# Patient Record
Sex: Male | Born: 1955 | Race: White | Hispanic: No | Marital: Married | Smoking: Former smoker
Health system: Southern US, Community
[De-identification: ages and names within clinical notes are randomized; demographics above are authoritative.]

## PROBLEM LIST (undated history)

## (undated) DIAGNOSIS — F32A Depression, unspecified: Secondary | ICD-10-CM

## (undated) DIAGNOSIS — E785 Hyperlipidemia, unspecified: Secondary | ICD-10-CM

## (undated) DIAGNOSIS — R413 Other amnesia: Secondary | ICD-10-CM

## (undated) DIAGNOSIS — F329 Major depressive disorder, single episode, unspecified: Secondary | ICD-10-CM

## (undated) DIAGNOSIS — K219 Gastro-esophageal reflux disease without esophagitis: Secondary | ICD-10-CM

## (undated) DIAGNOSIS — F419 Anxiety disorder, unspecified: Secondary | ICD-10-CM

## (undated) HISTORY — PX: TONSILLECTOMY: SUR1361

## (undated) HISTORY — DX: Depression, unspecified: F32.A

## (undated) HISTORY — DX: Hyperlipidemia, unspecified: E78.5

## (undated) HISTORY — PX: FRACTURE SURGERY: SHX138

## (undated) HISTORY — DX: Anxiety disorder, unspecified: F41.9

## (undated) HISTORY — DX: Other amnesia: R41.3

## (undated) HISTORY — PX: ROTATOR CUFF REPAIR: SHX139

## (undated) HISTORY — PX: COLONOSCOPY: SHX174

## (undated) HISTORY — DX: Major depressive disorder, single episode, unspecified: F32.9

## (undated) HISTORY — PX: INGUINAL HERNIA REPAIR: SHX194

---

## 2001-02-02 ENCOUNTER — Encounter (INDEPENDENT_AMBULATORY_CARE_PROVIDER_SITE_OTHER): Payer: Self-pay | Admitting: Specialist

## 2001-02-02 ENCOUNTER — Ambulatory Visit (HOSPITAL_COMMUNITY): Admission: RE | Admit: 2001-02-02 | Discharge: 2001-02-02 | Payer: Self-pay | Admitting: Surgery

## 2001-12-30 ENCOUNTER — Encounter: Payer: Self-pay | Admitting: Surgery

## 2001-12-30 ENCOUNTER — Encounter: Admission: RE | Admit: 2001-12-30 | Discharge: 2001-12-30 | Payer: Self-pay | Admitting: Surgery

## 2004-12-31 ENCOUNTER — Ambulatory Visit: Payer: Self-pay | Admitting: Internal Medicine

## 2005-08-06 ENCOUNTER — Ambulatory Visit: Payer: Self-pay | Admitting: Internal Medicine

## 2005-09-16 ENCOUNTER — Ambulatory Visit: Payer: Self-pay | Admitting: Internal Medicine

## 2005-12-10 ENCOUNTER — Ambulatory Visit: Payer: Self-pay | Admitting: Internal Medicine

## 2006-05-26 ENCOUNTER — Ambulatory Visit: Payer: Self-pay | Admitting: Internal Medicine

## 2006-11-05 ENCOUNTER — Ambulatory Visit: Payer: Self-pay | Admitting: Internal Medicine

## 2006-11-25 ENCOUNTER — Ambulatory Visit: Payer: Self-pay | Admitting: Internal Medicine

## 2006-11-25 LAB — CONVERTED CEMR LAB
ALT: 18 units/L (ref 0–40)
AST: 22 units/L (ref 0–37)
Albumin: 4.1 g/dL (ref 3.5–5.2)
Alkaline Phosphatase: 52 units/L (ref 39–117)
BUN: 20 mg/dL (ref 6–23)
Basophils Absolute: 0 10*3/uL (ref 0.0–0.1)
Basophils Relative: 0.3 % (ref 0.0–1.0)
Bilirubin, Direct: 0.1 mg/dL (ref 0.0–0.3)
CO2: 31 meq/L (ref 19–32)
Calcium: 9.6 mg/dL (ref 8.4–10.5)
Chloride: 106 meq/L (ref 96–112)
Cholesterol: 198 mg/dL (ref 0–200)
Creatinine, Ser: 0.8 mg/dL (ref 0.4–1.5)
Eosinophils Absolute: 0.1 10*3/uL (ref 0.0–0.6)
Eosinophils Relative: 1.8 % (ref 0.0–5.0)
GFR calc Af Amer: 131 mL/min
GFR calc non Af Amer: 108 mL/min
Glucose, Bld: 86 mg/dL (ref 70–99)
HCT: 41.2 % (ref 39.0–52.0)
HDL: 41.6 mg/dL (ref >39.0)
Hemoglobin: 14.8 g/dL (ref 13.0–17.0)
LDL Cholesterol: 136 mg/dL — ABNORMAL HIGH (ref 0–99)
Lymphocytes Relative: 30.6 % (ref 12.0–46.0)
MCHC: 35.9 g/dL (ref 30.0–36.0)
MCV: 88.1 fL (ref 78.0–100.0)
Monocytes Absolute: 0.5 10*3/uL (ref 0.2–0.7)
Monocytes Relative: 8.4 % (ref 3.0–11.0)
Neutro Abs: 3.6 10*3/uL (ref 1.4–7.7)
Neutrophils Relative %: 58.9 % (ref 43.0–77.0)
PSA: 1.01 ng/mL (ref 0.10–4.00)
Platelets: 270 10*3/uL (ref 150–400)
Potassium: 4.2 meq/L (ref 3.5–5.1)
RBC: 4.67 M/uL (ref 4.22–5.81)
RDW: 11.8 % (ref 11.5–14.6)
Sodium: 143 meq/L (ref 135–145)
TSH: 1.83 microintl units/mL (ref 0.35–5.50)
Total Bilirubin: 1.2 mg/dL (ref 0.3–1.2)
Total CHOL/HDL Ratio: 4.8
Total Protein: 6.9 g/dL (ref 6.0–8.3)
Triglycerides: 101 mg/dL (ref 0–149)
VLDL: 20 mg/dL (ref 0–40)
WBC: 6.1 10*3/uL (ref 4.5–10.5)

## 2006-12-01 ENCOUNTER — Ambulatory Visit: Payer: Self-pay | Admitting: Internal Medicine

## 2006-12-07 ENCOUNTER — Ambulatory Visit (HOSPITAL_COMMUNITY): Admission: RE | Admit: 2006-12-07 | Discharge: 2006-12-07 | Payer: Self-pay | Admitting: Orthopaedic Surgery

## 2006-12-29 ENCOUNTER — Ambulatory Visit: Payer: Self-pay | Admitting: Gastroenterology

## 2007-01-27 ENCOUNTER — Ambulatory Visit: Payer: Self-pay | Admitting: Gastroenterology

## 2007-02-13 ENCOUNTER — Encounter: Payer: Self-pay | Admitting: Internal Medicine

## 2007-02-13 ENCOUNTER — Telehealth: Payer: Self-pay | Admitting: Internal Medicine

## 2007-02-13 DIAGNOSIS — F329 Major depressive disorder, single episode, unspecified: Secondary | ICD-10-CM | POA: Insufficient documentation

## 2007-02-13 DIAGNOSIS — E785 Hyperlipidemia, unspecified: Secondary | ICD-10-CM | POA: Insufficient documentation

## 2007-02-13 DIAGNOSIS — F411 Generalized anxiety disorder: Secondary | ICD-10-CM | POA: Insufficient documentation

## 2010-03-07 ENCOUNTER — Telehealth: Payer: Self-pay | Admitting: Family Medicine

## 2010-03-07 ENCOUNTER — Ambulatory Visit: Payer: Self-pay | Admitting: Family Medicine

## 2010-03-14 ENCOUNTER — Telehealth: Payer: Self-pay | Admitting: Family Medicine

## 2010-03-20 ENCOUNTER — Telehealth: Payer: Self-pay | Admitting: Internal Medicine

## 2010-03-29 ENCOUNTER — Ambulatory Visit: Payer: Self-pay | Admitting: Internal Medicine

## 2010-03-29 ENCOUNTER — Telehealth: Payer: Self-pay | Admitting: Internal Medicine

## 2010-03-29 DIAGNOSIS — G4700 Insomnia, unspecified: Secondary | ICD-10-CM | POA: Insufficient documentation

## 2010-04-26 ENCOUNTER — Ambulatory Visit: Payer: Self-pay | Admitting: Internal Medicine

## 2010-04-30 ENCOUNTER — Ambulatory Visit: Payer: Self-pay | Admitting: Licensed Clinical Social Worker

## 2010-05-02 ENCOUNTER — Encounter: Payer: Self-pay | Admitting: Internal Medicine

## 2010-05-15 ENCOUNTER — Ambulatory Visit: Payer: Self-pay | Admitting: Licensed Clinical Social Worker

## 2010-05-16 ENCOUNTER — Ambulatory Visit: Payer: Self-pay | Admitting: Internal Medicine

## 2010-05-22 ENCOUNTER — Ambulatory Visit: Payer: Self-pay | Admitting: Licensed Clinical Social Worker

## 2010-05-29 ENCOUNTER — Ambulatory Visit: Payer: Self-pay | Admitting: Licensed Clinical Social Worker

## 2010-06-07 ENCOUNTER — Ambulatory Visit: Payer: Self-pay | Admitting: Licensed Clinical Social Worker

## 2010-06-19 ENCOUNTER — Ambulatory Visit: Payer: Self-pay | Admitting: Licensed Clinical Social Worker

## 2010-06-28 ENCOUNTER — Ambulatory Visit: Payer: Self-pay | Admitting: Licensed Clinical Social Worker

## 2010-07-05 ENCOUNTER — Ambulatory Visit: Payer: Self-pay | Admitting: Licensed Clinical Social Worker

## 2010-07-12 ENCOUNTER — Ambulatory Visit: Payer: Self-pay | Admitting: Licensed Clinical Social Worker

## 2010-07-18 ENCOUNTER — Ambulatory Visit: Payer: Self-pay | Admitting: Internal Medicine

## 2010-08-21 NOTE — Assessment & Plan Note (Signed)
Summary: anxiety/dm   Vital Signs:  Patient profile:   55 year old male Weight:      173 pounds Temp:     98.4 degrees F oral BP sitting:   122 / 80  (left arm) Cuff size:   regular  Vitals Entered By: Kathrynn Speed CMA (March 07, 2010 2:13 PM) CC: anxiety, 2 weeks, restness, trouble sleeping,maybe depression has stressful job, having trouble concentrating,having trouble getting things done, wife says he ask same question twice alot, src   History of Present Illness: Patient seen with daily symptoms of increased anxiety worsening over the past few weeks. Similar symptoms in past. Frequently feels jittery and uptight. 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 Denies depressive symptoms. Previously prescribed paroxetine but he did not take regularly. No history of panic disorder.  no history of alcohol binge use or any illicit drug use.  Anxiety symptoms are starting to impair his life.    Current Medications (verified): 1)  None  Allergies (verified): 1)  ! Sulf-10  Past History:  Past Medical History: Last updated: 02/13/2007 Anxiety Depression Hyperlipidemia  Review of Systems  The patient denies anorexia, fever, weight loss, chest pain, headaches, abdominal pain, muscle weakness, and depression.    Physical Exam  General:  Well-developed,well-nourished,in no acute distress; alert,appropriate and cooperative throughout examination Neck:  No deformities, masses, or tenderness noted. Lungs:  Normal respiratory effort, chest expands symmetrically. Lungs are clear to auscultation, no crackles or wheezes. Heart:  Normal rate and regular rhythm. S1 and S2 normal without gallop, murmur, click, rub or other extra sounds. Neurologic:  alert & oriented X3 and cranial nerves II-XII intact.   Psych:  normally interactive, good eye contact, not depressed appearing, and moderately anxious.     Impression & Recommendations:  Problem # 1:   ANXIETY (ICD-300.00) Assessment Deteriorated start Sertraline and follow up with Dr Kirtland Bouchard in 2-3 weeks. The following medications were removed from the medication list:    Paroxetine Hcl 20 Mg Tabs (Paroxetine hcl) .Marland Kitchen... 1 once daily    Lorazepam 0.5 Mg Tabs (Lorazepam) .Marland Kitchen... 1 two times a day as needed His updated medication list for this problem includes:    Sertraline Hcl 50 Mg Tabs (Sertraline hcl) ..... One by mouth once daily  Complete Medication List: 1)  Sertraline Hcl 50 Mg Tabs (Sertraline hcl) .... One by mouth once daily  Patient Instructions: 1)  Schedule follow up with Dr Kirtland Bouchard in 2-3 weeks. Prescriptions: SERTRALINE HCL 50 MG TABS (SERTRALINE HCL) one by mouth once daily  #30 x 3   Entered and Authorized by:   Evelena Peat MD   Signed by:   Evelena Peat MD on 03/07/2010   Method used:   [facility] to        [facility]  [address] [phone][facility] (retail[facility]       [address]       [place], [place]  [address]       Ph: [phone] or [phone]       Fax: [phone]   RxID:   [id number]

## 2010-08-21 NOTE — Progress Notes (Signed)
  Phone Note Call from Patient   Caller: Patient Call For: Gordy Savers  MD Summary of Call: Pt is asking for RX for sleep.......Marland Kitchenhe has appt with Dr Kirtland Bouchard 04/02/2010.   Initial call taken by: Lynann Beaver CMA,  March 20, 2010 12:49 PM  Follow-up for Phone Call        generic Ambien 10 mg, number 30 Follow-up by: Gordy Savers  MD,  March 20, 2010 1:20 PM    New/Updated Medications: AMBIEN 10 MG TABS (ZOLPIDEM TARTRATE) one by mouth q hs as needed insomnia Prescriptions: AMBIEN 10 MG TABS (ZOLPIDEM TARTRATE) one by mouth q hs as needed insomnia  #30 x 0   Entered by:   Lynann Beaver CMA   Authorized by:   Gordy Savers  MD   Signed by:   Lynann Beaver CMA on 03/20/2010   Method used:   Telephoned to ...       [facility]  [address] [phone]* (retail[facility]       [address]       [place], [place]  [address]       Ph: [phone] or [phone]       Fax: [phone]   RxID:   [id number]  LM on pt' s personal voice mail per pt request.

## 2010-08-21 NOTE — Medication Information (Signed)
Summary: Ambien Approved  Ambien Approved   Imported By: Maryln Gottron 05/03/2010 13:55:08  _____________________________________________________________________  External Attachment:    Type:   Image     Comment:   External Document

## 2010-08-21 NOTE — Assessment & Plan Note (Signed)
Summary: 2 month fup//ccm   Vital Signs:  Patient profile:   55 year old male Height:      70 inches Weight:      170 pounds BMI:     24.48 Temp:     98.8 degrees F oral Pulse rate:   72 / minute Resp:     14 per minute BP sitting:   120 / 70  (left arm)  Vitals Entered By: Willy Eddy, LPN (May 16, 2010 10:39 AM) CC: wants to increase sertraline Is Patient Diabetic? No   CC:  wants to increase sertraline.  History of Present Illness: 55 year old patient who is seen today for follow-up of his anxiety disorder and insomnia.  He feels that he is improved, but still having some sleep issues.  He alternates over-the-counter melatonin, with prescription Ambien.  He feels that he has done well, but feels she might do better with A. upward dose titration.  Anxiety is his chief complaint he denies any real depression.  Preventive Screening-Counseling & Management  Alcohol-Tobacco     Smoking Status: quit  Current Problems (verified): 1)  Insomnia  (ICD-780.52) 2)  Hyperlipidemia  (ICD-272.4) 3)  Depression  (ICD-311) 4)  Anxiety  (ICD-300.00)  Current Medications (verified): 1)  Ambien 10 Mg Tabs (Zolpidem Tartrate) .... One By Mouth Q Hs As Needed Insomnia 2)  Sertraline Hcl 100 Mg Tabs (Sertraline Hcl) .... One Daily 3)  Lorazepam 0.5 Mg Tabs (Lorazepam) .... One Twice Daily As Needed For Anxiety  Allergies (verified): 1)  ! Sulf-10  Past History:  Past Medical History: Reviewed history from 02/13/2007 and no changes required. Anxiety Depression Hyperlipidemia  Physical Exam  General:  Well-developed,well-nourished,in no acute distress; alert,appropriate and cooperative throughout examination Psych:  Cognition and judgment appear intact. Alert and cooperative with normal attention span and concentration. No apparent delusions, illusions, hallucinations   Impression & Recommendations:  Problem # 1:  INSOMNIA (ICD-780.52)  His updated medication list for  this problem includes:    Ambien 10 Mg Tabs (Zolpidem tartrate) ..... One by mouth q hs as needed insomnia  His updated medication list for this problem includes:    Ambien 10 Mg Tabs (Zolpidem tartrate) ..... One by mouth q hs as needed insomnia  Problem # 2:  ANXIETY (ICD-300.00)  His updated medication list for this problem includes:    Sertraline Hcl 100 Mg Tabs (Sertraline hcl) ..... One and one half tablets  daily    Lorazepam 0.5 Mg Tabs (Lorazepam) ..... One twice daily as needed for anxiety  His updated medication list for this problem includes:    Sertraline Hcl 100 Mg Tabs (Sertraline hcl) ..... One and one half tablets  daily    Lorazepam 0.5 Mg Tabs (Lorazepam) ..... One twice daily as needed for anxiety  Complete Medication List: 1)  Ambien 10 Mg Tabs (Zolpidem tartrate) .... One by mouth q hs as needed insomnia 2)  Sertraline Hcl 100 Mg Tabs (Sertraline hcl) .... One and one half tablets  daily 3)  Lorazepam 0.5 Mg Tabs (Lorazepam) .... One twice daily as needed for anxiety  Patient Instructions: 1)  Please schedule a follow-up appointment in 2 months. 2)  It is important that you exercise regularly at least 20 minutes 5 times a week. If you develop chest pain, have severe difficulty breathing, or feel very tired , stop exercising immediately and seek medical attention. Prescriptions: SERTRALINE HCL 100 MG TABS (SERTRALINE HCL) one and one half tablets  daily  #  135 x 3   Entered and Authorized by:   Gordy Savers  MD   Signed by:   Gordy Savers  MD on 05/16/2010   Method used:   Print then Give to Patient   RxID:   [id number] LORAZEPAM 0.5 MG TABS (LORAZEPAM) one twice daily as needed for anxiety  #60 x 3   Entered and Authorized by:   Gordy Savers  MD   Signed by:   Gordy Savers  MD on 05/16/2010   Method used:   Print then Give to Patient   RxID:   [id number] AMBIEN 10 MG TABS (ZOLPIDEM TARTRATE) one by mouth q hs as needed  insomnia  #30 x 2   Entered and Authorized by:   Gordy Savers  MD   Signed by:   Gordy Savers  MD on 05/16/2010   Method used:   Print then Give to Patient   RxID:   [id number]    Orders Added: 1)  Est. Patient Level III [74[address]]

## 2010-08-21 NOTE — Progress Notes (Signed)
Summary: zolpidem question  Phone Note Call from Patient   Caller: Patient Call For: Gordy Savers  MD Reason for Call: Talk to Doctor Summary of Call: wanted to know if it would be ok to take zolpidem every night. Please advise Initial call taken by: Duard Brady LPN,  March 29, 2010 4:44 PM  Follow-up for Phone Call        only as needed- attempt to avoid daily use Follow-up by: Gordy Savers  MD,  March 29, 2010 5:10 PM  Additional Follow-up for Phone Call Additional follow up Details #1::        attempt to call - ans mach - LMTCB if questions - try not to use nightly- only as needed.  Additional Follow-up by: Duard Brady LPN,  March 29, 2010 5:21 PM

## 2010-08-21 NOTE — Progress Notes (Signed)
Summary: increase meds?  Phone Note Call from Patient   Caller: Patient Call For: Gordy Savers  MD Summary of Call: Pt is taking Sertraline, and is asking if he can increase it.  Still complaining of nervousness, and agitated.  Not sleeping. Becomes confused at times........moods are up and down.  Pt is also complaining  of anxiety. [phone] Initial call taken by: Lynann Beaver CMA,  March 14, 2010 11:37 AM  Follow-up for Phone Call        Too soon to titrate.  Just started last week.  We need to make sure he has appt with Dr Kirtland Bouchard by next week at latest.  If he is truly confused should see him sooner. Follow-up by: Evelena Peat MD,  March 14, 2010 1:06 PM  Additional Follow-up for Phone Call Additional follow up Details #1::        Phone Call Completed Additional Follow-up by: Rudy Jew, RN,  March 14, 2010 1:16 PM

## 2010-08-21 NOTE — Assessment & Plan Note (Signed)
Summary: 2 WEEK F/U // RS   Vital Signs:  Patient profile:   55 year old male Height:      70 inches Weight:      169 pounds BMI:     24.34 Temp:     97.3 degrees F oral BP sitting:   108 / 70  (right arm) Cuff size:   regular  Vitals Entered By: Duard Brady LPN (April 26, 2010 12:52 PM) CC: 2 wk rov- doing better? still with anxiety? depression? Is Patient Diabetic? No   CC:  2 wk rov- doing better? still with anxiety? depression?.  History of Present Illness: 55 year old patient who is seen today for follow-up of his anxiety, depression, and insomnia.  His been on medications for about 6 weeks and has enjoyed some modest improvement.  Still having some anxiety and insomnia issues, but seems to have very little, if any, depression.  He has added  melatonin to his regimen.  Allergies: 1)  ! Sulf-10  Past History:  Past Medical History: Reviewed history from 02/13/2007 and no changes required. Anxiety Depression Hyperlipidemia  Review of Systems       The patient complains of depression.  The patient denies anorexia, fever, weight loss, weight gain, vision loss, decreased hearing, hoarseness, chest pain, syncope, dyspnea on exertion, peripheral edema, prolonged cough, headaches, hemoptysis, abdominal pain, melena, hematochezia, severe indigestion/heartburn, hematuria, incontinence, genital sores, muscle weakness, transient blindness, difficulty walking, unusual weight change, abnormal bleeding, enlarged lymph nodes, angioedema, breast masses, and testicular masses.    Physical Exam  General:  Well-developed,well-nourished,in no acute distress; alert,appropriate and cooperative throughout examination Psych:  Cognition and judgment appear intact. Alert and cooperative with normal attention span and concentration. No apparent delusions, illusions, hallucinations   Impression & Recommendations:  Problem # 1:  INSOMNIA (ICD-780.52)  His updated medication list for  this problem includes:    Ambien 10 Mg Tabs (Zolpidem tartrate) ..... One by mouth q hs as needed insomnia  His updated medication list for this problem includes:    Ambien 10 Mg Tabs (Zolpidem tartrate) ..... One by mouth q hs as needed insomnia  Problem # 2:  ANXIETY (ICD-300.00)  His updated medication list for this problem includes:    Sertraline Hcl 100 Mg Tabs (Sertraline hcl) ..... One daily    Lorazepam 0.5 Mg Tabs (Lorazepam) ..... One twice daily as needed for anxiety  His updated medication list for this problem includes:    Sertraline Hcl 100 Mg Tabs (Sertraline hcl) ..... One daily    Lorazepam 0.5 Mg Tabs (Lorazepam) ..... One twice daily as needed for anxiety  Complete Medication List: 1)  Ambien 10 Mg Tabs (Zolpidem tartrate) .... One by mouth q hs as needed insomnia 2)  Sertraline Hcl 100 Mg Tabs (Sertraline hcl) .... One daily 3)  Lorazepam 0.5 Mg Tabs (Lorazepam) .... One twice daily as needed for anxiety  Other Orders: Flu Vaccine 32yrs + (11914) Admin 1st Vaccine ([address])  Patient Instructions: 1)  Please schedule a follow-up appointment in 2 months. 2)  Limit your Sodium (Salt). 3)  It is important that you exercise regularly at least 20 minutes 5 times a week. If you develop chest pain, have severe difficulty breathing, or feel very tired , stop exercising immediately and seek medical attention. 4)  behavioral health referral as discussed Prescriptions: LORAZEPAM 0.5 MG TABS (LORAZEPAM) one twice daily as needed for anxiety  #60 x 3   Entered and Authorized by:   Janett Labella  Amador Cunas  MD   Signed by:   Gordy Savers  MD on 04/26/2010   Method used:   Print then Give to Patient   RxID:   [id number] SERTRALINE HCL 100 MG TABS (SERTRALINE HCL) one daily  #90 x 0   Entered and Authorized by:   Gordy Savers  MD   Signed by:   Gordy Savers  MD on 04/26/2010   Method used:   Print then Give to Patient   RxID:   [id number] AMBIEN 10  MG TABS (ZOLPIDEM TARTRATE) one by mouth q hs as needed insomnia  #30 x 0   Entered and Authorized by:   Gordy Savers  MD   Signed by:   Gordy Savers  MD on 04/26/2010   Method used:   Print then Give to Patient   RxID:   [id number]    Immunizations Administered:  Influenza Vaccine # 1:    Vaccine Type: Fluvax 3+    Site: left deltoid    Mfr: [employer]    Dose: 0.5 ml    Route: IM    Given by: Duard Brady LPN    Exp. Date: 01/19/2011    Lot #: [id number]    VIS given: 02/13/10 version given April 26, 2010.    Physician counseled: yes  Flu Vaccine Consent Questions:    Do you have a history of severe allergic reactions to this vaccine? no    Any prior history of allergic reactions to egg and/or gelatin? no    Do you have a sensitivity to the preservative Thimersol? no    Do you have a past history of Guillan-Barre Syndrome? no    Do you currently have an acute febrile illness? no    Have you ever had a severe reaction to latex? no    Vaccine information given and explained to patient? yes

## 2010-08-21 NOTE — Assessment & Plan Note (Signed)
Summary: to discuss a med increase/njr   Vital Signs:  Patient profile:   55 year old male Weight:      169 pounds Temp:     98.5 degrees F oral BP sitting:   112 / 80  (left arm) Cuff size:   regular  Vitals Entered By: Duard Brady LPN (March 29, 2010 2:57 PM) CC: c/o medication concerns - c/o increased nervousness, anxious, insomnia  ??depression Is Patient Diabetic? No   CC:  c/o medication concerns - c/o increased nervousness, anxious, and insomnia  ??depression.  History of Present Illness: 55 -year-old patient, who was seen approximately 4 weeks ago complaining of increasing nervousness, anxiety, poor sleep habits, and some depression.  He has been under considerable situational stress and does work.  A third shift.  He was placed on sertraline, with only modest improvement.  Is requesting a p.r.n. medication to assist with his anxiety.  He does work around Engineer, manufacturing systems.  Allergies: 1)  ! Sulf-10  Past History:  Past Medical History: Reviewed history from 02/13/2007 and no changes required. Anxiety Depression Hyperlipidemia  Physical Exam  General:  Well-developed,well-nourished,in no acute distress; alert,appropriate and cooperative throughout examination; normal blood pressure Psych:  Cognition and judgment appear intact. Alert and cooperative with normal attention span and concentration. No apparent delusions, illusions, hallucinations   Impression & Recommendations:  Problem # 1:  DEPRESSION (ICD-311)  The following medications were removed from the medication list:    Sertraline Hcl 50 Mg Tabs (Sertraline hcl) ..... One by mouth once daily His updated medication list for this problem includes:    Sertraline Hcl 100 Mg Tabs (Sertraline hcl) ..... One daily    Lorazepam 0.5 Mg Tabs (Lorazepam) ..... One twice daily as needed for anxiety  The following medications were removed from the medication list:    Sertraline Hcl 50 Mg Tabs (Sertraline  hcl) ..... One by mouth once daily His updated medication list for this problem includes:    Sertraline Hcl 100 Mg Tabs (Sertraline hcl) ..... One daily    Lorazepam 0.5 Mg Tabs (Lorazepam) ..... One twice daily as needed for anxiety  Problem # 2:  ANXIETY (ICD-300.00)  The following medications were removed from the medication list:    Sertraline Hcl 50 Mg Tabs (Sertraline hcl) ..... One by mouth once daily His updated medication list for this problem includes:    Sertraline Hcl 100 Mg Tabs (Sertraline hcl) ..... One daily    Lorazepam 0.5 Mg Tabs (Lorazepam) ..... One twice daily as needed for anxiety  The following medications were removed from the medication list:    Sertraline Hcl 50 Mg Tabs (Sertraline hcl) ..... One by mouth once daily His updated medication list for this problem includes:    Sertraline Hcl 100 Mg Tabs (Sertraline hcl) ..... One daily    Lorazepam 0.5 Mg Tabs (Lorazepam) ..... One twice daily as needed for anxiety  Problem # 3:  INSOMNIA (ICD-780.52)  His updated medication list for this problem includes:    Ambien 10 Mg Tabs (Zolpidem tartrate) ..... One by mouth q hs as needed insomnia sleep hygiene issues discussed  His updated medication list for this problem includes:    Ambien 10 Mg Tabs (Zolpidem tartrate) ..... One by mouth q hs as needed insomnia  Complete Medication List: 1)  Ambien 10 Mg Tabs (Zolpidem tartrate) .... One by mouth q hs as needed insomnia 2)  Sertraline Hcl 100 Mg Tabs (Sertraline hcl) .... One daily 3)  Lorazepam 0.5 Mg Tabs (Lorazepam) .... One twice daily as needed for anxiety  Patient Instructions: 1)  Please schedule a follow-up appointment in 1 month. Prescriptions: LORAZEPAM 0.5 MG TABS (LORAZEPAM) one twice daily as needed for anxiety  #0 x 0   Entered and Authorized by:   Gordy Savers  MD   Signed by:   Gordy Savers  MD on 03/29/2010   Method used:   Print then Give to Patient   RxID:    [id number] SERTRALINE HCL 100 MG TABS (SERTRALINE HCL) one daily  #90 x 0   Entered and Authorized by:   Gordy Savers  MD   Signed by:   Gordy Savers  MD on 03/29/2010   Method used:   Print then Give to Patient   RxID:   [id number]   Appended Document: to discuss a med increase/njr   rec'd phone call from pharmacy - lorazopam written with no quanity or refills - DISP # 60 RF3   KIK  Clinical Lists Changes  Medications: Rx of LORAZEPAM 0.5 MG TABS (LORAZEPAM) one twice daily as needed for anxiety;  #60 x 3;  Signed;  Entered by: Duard Brady LPN;  Authorized by: Gordy Savers  MD;  Method used: Historical    Prescriptions: LORAZEPAM 0.5 MG TABS (LORAZEPAM) one twice daily as needed for anxiety  #60 x 3   Entered by:   Duard Brady LPN   Authorized by:   Gordy Savers  MD   Signed by:   Duard Brady LPN on 84/69/6295   Method used:   Historical   RxID:   [id number]

## 2010-08-21 NOTE — Progress Notes (Signed)
Summary: REQ FOR INFO CONCERNING MED / NEW MED NEEDS TO BE ADDED TO LIST  Phone Note Call from Patient   Caller: Patient  [phone] Summary of Call: Pt called in to adv that he needs to have med added to his current medication list...David KitchenMarland Webster pt adv that he is currently using  Alteril  (OTC sleep aid)......David KitchenPt wants to know if he should stop taking this med since he will now be taking Setraline as prescribed by Dr Caryl Never.... Pt was advised that info would be passed along to Dr Caryl Never.... Pt to also ask [facility]ist at [facility] about taking med - Alteril, when he goes to p/u new Rx for med: Setraline.  Pt can be reached at  [phone].  Initial call taken by: Debbra Riding,  March 07, 2010 4:08 PM  Follow-up for Phone Call        I am not aware to any interaction. Follow-up by: Evelena Peat MD,  March 07, 2010 5:07 PM  Additional Follow-up for Phone Call Additional follow up Details #1::        Lft msg on personally identified v/m for pt ref same.  Additional Follow-up by: Debbra Riding,  March 08, 2010 8:30 AM     Appended Document: REQ FOR INFO CONCERNING MED / NEW MED NEEDS TO BE ADDED TO LIST Pt called to adv that he rec msg and he is going to try taking otc melatonin only [facility].... will call back if he feels like he needs to come in for ov before next scheduled appt.

## 2010-08-23 NOTE — Assessment & Plan Note (Signed)
Summary: 2 month rov/njr   Vital Signs:  Patient profile:   55 year old male Weight:      176 pounds Temp:     98.2 degrees F oral BP sitting:   130 / 80  (left arm) Cuff size:   regular  Vitals Entered By: Duard Brady LPN (July 18, 2010 10:44 AM) CC: 2 mon rov---still a little edgy, nervous  Is Patient Diabetic? No   CC:  2 mon rov---still a little edgy and nervous .  History of Present Illness: 6 -year-old patient who is seen today for follow-up.  He is being treated  for generalized anxiety disorder and his sertraline has been titrated to 150  mg daily.  He still is complaining of some anxiety, but feels that he is much improved.  He feels that he has benefited from a dose titration from 100  to 150 mg.  Allergies: 1)  ! Sulf-10  Past History:  Past Medical History: Reviewed history from 02/13/2007 and no changes required. Anxiety Depression Hyperlipidemia  Review of Systems       insomnia improved; uses alprazolam, every morning, prior to work only  Physical Exam  General:  Well-developed,well-nourished,in no acute distress; alert,appropriate and cooperative throughout examination Skin:  Intact without suspicious lesions or rashes   Impression & Recommendations:  Problem # 1:  INSOMNIA (ICD-780.52)  His updated medication list for this problem includes:    Ambien 10 Mg Tabs (Zolpidem tartrate) ..... One by mouth q hs as needed insomnia  Problem # 2:  ANXIETY (ICD-300.00)  His updated medication list for this problem includes:    Sertraline Hcl 100 Mg Tabs (Sertraline hcl) .Marland Kitchen..Marland Kitchen Two every am    Lorazepam 0.5 Mg Tabs (Lorazepam) ..... One twice daily as needed for anxiety  Complete Medication List: 1)  Ambien 10 Mg Tabs (Zolpidem tartrate) .... One by mouth q hs as needed insomnia 2)  Sertraline Hcl 100 Mg Tabs (Sertraline hcl) .... Two every am 3)  Lorazepam 0.5 Mg Tabs (Lorazepam) .... One twice daily as needed for anxiety  Patient  Instructions: 1)  Please schedule a follow-up appointment in 3 months. 2)  It is important that you exercise regularly at least 20 minutes 5 times a week. If you develop chest pain, have severe difficulty breathing, or feel very tired , stop exercising immediately and seek medical attention. Prescriptions: SERTRALINE HCL 100 MG TABS (SERTRALINE HCL) two every am  #180 x 3   Entered and Authorized by:   Gordy Savers  MD   Signed by:   Gordy Savers  MD on 07/18/2010   Method used:   [facility] to        [facility]  [address] [phone][facility] (retail)       [address]       [place], [place]  [address]       Ph: [phone] or [phone]       Fax: [phone]   RxID:   [id number]    Orders Added: 1)  Est. Patient Level III [[address]]

## 2010-10-12 ENCOUNTER — Encounter: Payer: Self-pay | Admitting: Internal Medicine

## 2010-10-15 ENCOUNTER — Encounter: Payer: Self-pay | Admitting: Internal Medicine

## 2010-10-15 ENCOUNTER — Ambulatory Visit (INDEPENDENT_AMBULATORY_CARE_PROVIDER_SITE_OTHER): Payer: Federal, State, Local not specified - PPO | Admitting: Internal Medicine

## 2010-10-15 DIAGNOSIS — G4700 Insomnia, unspecified: Secondary | ICD-10-CM

## 2010-10-15 DIAGNOSIS — F3289 Other specified depressive episodes: Secondary | ICD-10-CM

## 2010-10-15 DIAGNOSIS — F411 Generalized anxiety disorder: Secondary | ICD-10-CM

## 2010-10-15 DIAGNOSIS — F329 Major depressive disorder, single episode, unspecified: Secondary | ICD-10-CM

## 2010-10-15 MED ORDER — SERTRALINE HCL 100 MG PO TABS: 100.0000 mg | ORAL_TABLET | Freq: Two times a day (BID) | ORAL | Status: DC

## 2010-10-15 MED ORDER — SERTRALINE HCL 100 MG PO TABS: ORAL_TABLET | ORAL | Status: DC

## 2010-10-15 MED ORDER — LORAZEPAM 0.5 MG PO TABS: 0.5000 mg | ORAL_TABLET | ORAL | Status: DC

## 2010-10-15 NOTE — Patient Instructions (Signed)
It is important that you exercise regularly, at least 20 minutes 3 to 4 times per week.  If you develop chest pain or shortness of breath seek  medical attention.   return in 3 months for your annual physical

## 2010-10-15 NOTE — Progress Notes (Signed)
  Subjective:    Patient ID: David Webster, male    DOB: 27-Oct-1955, 55 y.o.   MRN: [id number]  HPI   55 year old patient who is seen today for followup of his anxiety depression. He is much improved and has decreased his lorazepam dose to once daily prior to going to work. He continues to work a part-time schedule from approximately 3 AM to 9 or 10 AM. He is sleeping much better he takes sertraline 100 mg daily at approximately 6 PM daily he is sleeping much better. No new concerns or complaints. He is quite pleased with his progress.   Review of Systems  Constitutional: Negative for fever, chills, appetite change and fatigue.  HENT: Negative for hearing loss, ear pain, congestion, sore throat, trouble swallowing, neck stiffness, dental problem, voice change and tinnitus.   Eyes: Negative for pain, discharge and visual disturbance.  Respiratory: Negative for cough, chest tightness, wheezing and stridor.   Cardiovascular: Negative for chest pain, palpitations and leg swelling.  Gastrointestinal: Negative for nausea, vomiting, abdominal pain, diarrhea, constipation, blood in stool and abdominal distention.  Genitourinary: Negative for urgency, hematuria, flank pain, discharge, difficulty urinating and genital sores.  Musculoskeletal: Negative for myalgias, back pain, joint swelling, arthralgias and gait problem.  Skin: Negative for rash.  Neurological: Negative for dizziness, syncope, speech difficulty, weakness, numbness and headaches.  Hematological: Negative for adenopathy. Does not bruise/bleed easily.  Psychiatric/Behavioral: Negative for behavioral problems and dysphoric mood. The patient is nervous/anxious.        Objective:   Physical Exam  Constitutional: He appears well-developed and well-nourished. No distress.  Psychiatric: He has a normal mood and affect. His behavior is normal. Judgment and thought content normal.          Assessment & Plan:   anxiety depression improving  clinically stable  Insomnia also improved.   We'll schedule for a annual exam in 3 months. We'll continue his present medical regimen.

## 2010-10-16 ENCOUNTER — Telehealth: Payer: Self-pay

## 2010-10-16 MED ORDER — LORAZEPAM 0.5 MG PO TABS: ORAL_TABLET | ORAL | Status: DC

## 2010-10-16 NOTE — Telephone Encounter (Signed)
Called - change in sig for lorazopam . Changed med list

## 2010-12-04 NOTE — Assessment & Plan Note (Signed)
[facility]                            [facility] OFFICE NOTE   NAME:Marzette, Benney                           MRN:          [id number]  DATE:12/01/2006                            DOB:          [birth year]    A 55 year old gentleman seen today for a annual exam.  He is complaining  of consistent right shoulder pain after trauma several weeks ago.  He  has a history of anxiety and depression.   PAST MEDICAL HISTORY:  Fairly unremarkable, has had hernia repairs and  some minor surgery for anal fissure.  No other concerns or complaints  today.   FAMILY HISTORY:  Father died at 33 of complication of a stroke.  Mother  is in her 50s in good health as is his sister.   EXAMINATION:  GENERAL:  Revealed a healthy appearing male in no acute  distress.  VITAL SIGNS:  Blood pressure was 120/78.  FUNDI, EAR, NOSE AND THROAT:  Clear.  NECK:  No adenopathy or thyroid enlargement.  CHEST:  Clear.  CARDIOVASCULAR:  Normal heart sounds, no murmurs.  ABDOMEN:  Benign, no organomegaly.  EXTERNAL GENITALIA:  Normal.  RECTAL:  Prostate is small and benign, stool heme negative.  EXTREMITIES:  Full peripheral pulses, no edema.   IMPRESSION:  History of anxiety depression status post hernia repair,  mild dyslipidemia.   DISPOSITION:  We will schedule for a screening colonoscopy and also a  orthopedic consultation, recheck here in one year or p.r.n.     Gordy Savers, MD  Electronically Signed    PFK/MedQ  DD: 12/01/2006  DT: 12/01/2006  Job #: [id number]

## 2010-12-07 NOTE — Op Note (Signed)
[place]  Patient:    [name], David Webster                           MRN: [id number] Proc. Date: 02/02/01 Adm. Date:  [id number] Attending:  Katha Cabal                           Operative Report  PREOPERATIVE DIAGNOSIS:  Right inguinal hernia.  POSTOPERATIVE DIAGNOSIS:  Right inguinal hernia.  OPERATION PERFORMED:  Right direct inguinal hernia.  SURGEON:  Thornton Park. Daphine Deutscher, M.D.  ANESTHESIA:  General LMA.  INDICATIONS FOR PROCEDURE:  The patient is a 55 year old gentleman on whom I had previously repaired a left inguinal hernia.  DESCRIPTION OF PROCEDURE:  He was taken to room 17 and given general by LMA. The area was shaved and prepped with Betadine thoroughly and draped sterilely. I made a small oblique incision in the right lower quadrant of the inguinal canal, divided the superficial vessels with 4-0 Vicryl and exposed the external oblique.  I incised that along the fibers and mobilized the cord and displaced it laterally and found a large bulge consistent with a direct hernia defect.  I went ahead and incised the cremasteric fibers.  The ilioinguinal nerve branches were not disturbed.  I mobilized the lipoma of the cord and did a high ligation of this.  I did not see an indirect sac.  I went head and incised the floor and then imbricated the floor of the transversalis fascia and approximated that with a running 2-0 Vicryl.  I then cut a piece of mesh to fit the floor and sutured it at the inguinal ligament with a running 2-0 Prolene and above to internal oblique with running 2-0 Prolene and then split that and brought it around and sutured it to itself with a horizontal mattress suture of 2-0 Prolene.  This was tucked beneath the external oblique.  The external oblique was then closed with running 2-0 Vicryl.  The area was injected with Marcaine 0.5% and then closed with 4-0 Vicryl subcutaneously and subcuticularly with 5-0 Vicryl  and benzoin and Steri-Strips.  The patient tolerated the procedure well.  He will be given Tylox to take for pain and will be followed up in the office in about two weeks. DD:  02/02/01 TD:  02/02/01 Job: [id number]

## 2010-12-18 ENCOUNTER — Other Ambulatory Visit (INDEPENDENT_AMBULATORY_CARE_PROVIDER_SITE_OTHER): Payer: Federal, State, Local not specified - PPO

## 2010-12-18 DIAGNOSIS — Z0000 Encounter for general adult medical examination without abnormal findings: Secondary | ICD-10-CM

## 2010-12-18 LAB — CBC WITH DIFFERENTIAL/PLATELET
Basophils Relative: 0.4 % (ref 0.0–3.0)
Eosinophils Absolute: 0.3 10*3/uL (ref 0.0–0.7)
Hemoglobin: 14.3 g/dL (ref 13.0–17.0)
Lymphocytes Relative: 20.7 % (ref 12.0–46.0)
MCHC: 34.7 g/dL (ref 30.0–36.0)
Monocytes Relative: 7.2 % (ref 3.0–12.0)
Neutro Abs: 6.6 10*3/uL (ref 1.4–7.7)
Neutrophils Relative %: 69 % (ref 43.0–77.0)
RBC: 4.54 Mil/uL (ref 4.22–5.81)
WBC: 9.5 10*3/uL (ref 4.5–10.5)

## 2010-12-18 LAB — LIPID PANEL
HDL: 38.6 mg/dL — ABNORMAL LOW (ref >39.00)
Total CHOL/HDL Ratio: 4
VLDL: 15.4 mg/dL (ref 0.0–40.0)

## 2010-12-18 LAB — HEPATIC FUNCTION PANEL
AST: 22 U/L (ref 0–37)
Alkaline Phosphatase: 69 U/L (ref 39–117)
Bilirubin, Direct: 0.1 mg/dL (ref 0.0–0.3)
Total Protein: 7 g/dL (ref 6.0–8.3)

## 2010-12-18 LAB — POCT URINALYSIS DIPSTICK
Bilirubin, UA: NEGATIVE
Leukocytes, UA: NEGATIVE
Nitrite, UA: NEGATIVE
Protein, UA: NEGATIVE
pH, UA: 5

## 2010-12-18 LAB — BASIC METABOLIC PANEL
Calcium: 9 mg/dL (ref 8.4–10.5)
Creatinine, Ser: 0.9 mg/dL (ref 0.4–1.5)
GFR: 93.09 mL/min (ref >60.00)
Sodium: 140 mEq/L (ref 135–145)

## 2010-12-18 LAB — PSA: PSA: 1.31 ng/mL (ref 0.10–4.00)

## 2010-12-24 ENCOUNTER — Ambulatory Visit (INDEPENDENT_AMBULATORY_CARE_PROVIDER_SITE_OTHER): Payer: Federal, State, Local not specified - PPO | Admitting: Internal Medicine

## 2010-12-24 ENCOUNTER — Encounter: Payer: Self-pay | Admitting: Internal Medicine

## 2010-12-24 DIAGNOSIS — Z0000 Encounter for general adult medical examination without abnormal findings: Secondary | ICD-10-CM

## 2010-12-24 DIAGNOSIS — F329 Major depressive disorder, single episode, unspecified: Secondary | ICD-10-CM

## 2010-12-24 DIAGNOSIS — F411 Generalized anxiety disorder: Secondary | ICD-10-CM

## 2010-12-24 MED ORDER — SERTRALINE HCL 100 MG PO TABS: ORAL_TABLET | ORAL | Status: DC

## 2010-12-24 MED ORDER — LORAZEPAM 0.5 MG PO TABS: ORAL_TABLET | ORAL | Status: DC

## 2010-12-24 NOTE — Progress Notes (Signed)
  Subjective:    Patient ID: David Webster, male    DOB: [birth year], 55 y.o.   MRN: [id number]  HPI   55 year old patient who is seen today for an annual health maintenance examination. He is doing quite well. He does have a history of anxiety and depression which has been well controlled on his present regimen no new concerns or complaints today    Review of Systems  Constitutional: Negative for fever, chills, activity change, appetite change and fatigue.  HENT: Negative for hearing loss, ear pain, congestion, rhinorrhea, sneezing, mouth sores, trouble swallowing, neck pain, neck stiffness, dental problem, voice change, sinus pressure and tinnitus.   Eyes: Negative for photophobia, pain, redness and visual disturbance.  Respiratory: Negative for apnea, cough, choking, chest tightness, shortness of breath and wheezing.   Cardiovascular: Negative for chest pain, palpitations and leg swelling.  Gastrointestinal: Negative for nausea, vomiting, abdominal pain, diarrhea, constipation, blood in stool, abdominal distention, anal bleeding and rectal pain.  Genitourinary: Negative for dysuria, urgency, frequency, hematuria, flank pain, decreased urine volume, discharge, penile swelling, scrotal swelling, difficulty urinating, genital sores and testicular pain.  Musculoskeletal: Negative for myalgias, back pain, joint swelling, arthralgias and gait problem.  Skin: Negative for color change, rash and wound.  Neurological: Negative for dizziness, tremors, seizures, syncope, facial asymmetry, speech difficulty, weakness, light-headedness, numbness and headaches.  Hematological: Negative for adenopathy. Does not bruise/bleed easily.  Psychiatric/Behavioral: Negative for suicidal ideas, hallucinations, behavioral problems, confusion, sleep disturbance, self-injury, dysphoric mood, decreased concentration and agitation. The patient is not nervous/anxious.        Objective:   Physical Exam  Constitutional: He  appears well-developed and well-nourished.  HENT:  Head: Normocephalic and atraumatic.  Right Ear: External ear normal.  Left Ear: External ear normal.  Nose: Nose normal.  Mouth/Throat: Oropharynx is clear and moist.  Eyes: Conjunctivae and EOM are normal. Pupils are equal, round, and reactive to light. No scleral icterus.  Neck: Normal range of motion. Neck supple. No JVD present. No thyromegaly present.  Cardiovascular: Regular rhythm, normal heart sounds and intact distal pulses.  Exam reveals no gallop and no friction rub.   No murmur heard. Pulmonary/Chest: Effort normal and breath sounds normal. He exhibits no tenderness.  Abdominal: Soft. Bowel sounds are normal. He exhibits no distension and no mass. There is no tenderness.  Genitourinary: Prostate normal and penis normal.  Musculoskeletal: Normal range of motion. He exhibits no edema and no tenderness.  Lymphadenopathy:    He has no cervical adenopathy.  Neurological: He is alert. He has normal reflexes. No cranial nerve deficit. Coordination normal.  Skin: Skin is warm and dry. No rash noted.  Psychiatric: He has a normal mood and affect. His behavior is normal.          Assessment & Plan:   Unremarkable health maintenance examination. Heart healthy diet regular exercise also encouraged. His medical regimen will be unchanged. Refills provided. Laboratory studies reviewed with the patient

## 2010-12-24 NOTE — Patient Instructions (Signed)
It is important that you exercise regularly, at least 20 minutes 3 to 4 times per week.  If you develop chest pain or shortness of breath seek  medical attention.  Return in one year for follow-up   

## 2011-11-01 ENCOUNTER — Other Ambulatory Visit: Payer: Self-pay | Admitting: Internal Medicine

## 2011-11-20 ENCOUNTER — Encounter: Payer: Self-pay | Admitting: Gastroenterology

## 2011-12-31 ENCOUNTER — Ambulatory Visit (INDEPENDENT_AMBULATORY_CARE_PROVIDER_SITE_OTHER): Payer: Federal, State, Local not specified - PPO | Admitting: Internal Medicine

## 2011-12-31 ENCOUNTER — Encounter: Payer: Self-pay | Admitting: Internal Medicine

## 2011-12-31 VITALS — BP 124/80 | Temp 98.0°F | Wt 190.0 lb

## 2011-12-31 DIAGNOSIS — F329 Major depressive disorder, single episode, unspecified: Secondary | ICD-10-CM

## 2011-12-31 DIAGNOSIS — E785 Hyperlipidemia, unspecified: Secondary | ICD-10-CM

## 2011-12-31 DIAGNOSIS — H6690 Otitis media, unspecified, unspecified ear: Secondary | ICD-10-CM

## 2011-12-31 NOTE — Progress Notes (Signed)
  Subjective:    Patient ID: David Webster, male    DOB: 05-May-1956, 56 y.o.   MRN: [id number]  HPI  56 year old patient who has a history of anxiety depression and mild dyslipidemia. He is seen here today with a two-day history of some bleeding from last year. At the present time he has no pain or discharge. Bleeding occurred after an attempt to clean his canal with a Q-tip. No hearing deficit   Review of Systems  Constitutional: Negative for fever, chills, appetite change and fatigue.  HENT: Negative for hearing loss, ear pain, congestion, sore throat, trouble swallowing, neck stiffness, dental problem, voice change and tinnitus.   Eyes: Negative for pain, discharge and visual disturbance.  Respiratory: Negative for cough, chest tightness, wheezing and stridor.   Cardiovascular: Negative for chest pain, palpitations and leg swelling.  Gastrointestinal: Negative for nausea, vomiting, abdominal pain, diarrhea, constipation, blood in stool and abdominal distention.  Genitourinary: Negative for urgency, hematuria, flank pain, discharge, difficulty urinating and genital sores.  Musculoskeletal: Negative for myalgias, back pain, joint swelling, arthralgias and gait problem.  Skin: Negative for rash.  Neurological: Negative for dizziness, syncope, speech difficulty, weakness, numbness and headaches.  Hematological: Negative for adenopathy. Does not bruise/bleed easily.  Psychiatric/Behavioral: Negative for behavioral problems and dysphoric mood. The patient is not nervous/anxious.        Objective:   Physical Exam  Constitutional: He appears well-developed and well-nourished. No distress.       Blood pressure well controlled  HENT:       The right tympanic membrane and canal normal The left tympanic membrane was atraumatic. The canal had a light coating of spotty areas of dried blood  Weber and Ren normal          Assessment & Plan:   Traumatic otitis. The patient was advised to  discontinue use of Q-tips. He'll gently irrigate his canal following showering. We'll see for his annual physical within the next 6 months

## 2011-12-31 NOTE — Patient Instructions (Signed)
Return for your annual exam history at your convenience    It is important that you exercise regularly, at least 20 minutes 3 to 4 times per week.  If you develop chest pain or shortness of breath seek  medical attention.

## 2012-01-06 ENCOUNTER — Other Ambulatory Visit: Payer: Federal, State, Local not specified - PPO

## 2012-01-16 ENCOUNTER — Encounter: Payer: Federal, State, Local not specified - PPO | Admitting: Internal Medicine

## 2012-02-04 ENCOUNTER — Encounter: Payer: Self-pay | Admitting: Gastroenterology

## 2012-03-16 ENCOUNTER — Ambulatory Visit (AMBULATORY_SURGERY_CENTER): Payer: Federal, State, Local not specified - PPO

## 2012-03-16 VITALS — Ht 69.0 in | Wt 184.0 lb

## 2012-03-16 DIAGNOSIS — Z1211 Encounter for screening for malignant neoplasm of colon: Secondary | ICD-10-CM

## 2012-03-16 MED ORDER — MOVIPREP 100 G PO SOLR: 1.0000 | Freq: Once | ORAL | Status: DC

## 2012-03-30 ENCOUNTER — Encounter: Payer: Self-pay | Admitting: *Deleted

## 2012-03-30 ENCOUNTER — Encounter: Payer: Self-pay | Admitting: Gastroenterology

## 2012-03-30 ENCOUNTER — Ambulatory Visit (AMBULATORY_SURGERY_CENTER): Payer: Federal, State, Local not specified - PPO | Admitting: Gastroenterology

## 2012-03-30 VITALS — BP 127/91 | HR 64 | Temp 98.1°F | Resp 17 | Ht 69.0 in | Wt 184.0 lb

## 2012-03-30 DIAGNOSIS — Z860100 Personal history of colon polyps, unspecified: Secondary | ICD-10-CM

## 2012-03-30 DIAGNOSIS — Z1211 Encounter for screening for malignant neoplasm of colon: Secondary | ICD-10-CM

## 2012-03-30 MED ORDER — SODIUM CHLORIDE 0.9 % IV SOLN: 500.0000 mL | INTRAVENOUS | Status: DC

## 2012-03-30 NOTE — Progress Notes (Signed)
Pressure applied to the abdomen to reach the cecum 

## 2012-03-30 NOTE — Patient Instructions (Addendum)

## 2012-03-30 NOTE — Progress Notes (Signed)
Patient did not experience any of the following events: a burn prior to discharge; a fall within the facility; wrong site/side/patient/procedure/implant event; or a hospital transfer or hospital admission upon discharge from the facility. (G8907) Patient did not have preoperative order for IV antibiotic SSI prophylaxis. (G8918)  

## 2012-03-30 NOTE — Op Note (Addendum)
[facility] [address] [place], [address]   COLONOSCOPY PROCEDURE REPORT  PATIENT: David Webster, David Webster  MR#: [id number] BIRTHDATE: 31-May-1956 , 56  yrs. old GENDER: Male ENDOSCOPIST: Meryl Dare, MD, [facility] REFERRED BY: PROCEDURE DATE:  03/30/2012 PROCEDURE:   Colonoscopy, diagnostic ASA CLASS:   Class II INDICATIONS: patient's personal history of colon polyps-type unknown  MEDICATIONS: MAC sedation, administered by CRNA and propofol (Diprivan) 180mg  IV DESCRIPTION OF PROCEDURE:   After the risks benefits and alternatives of the procedure were thoroughly explained, informed consent was obtained.  A digital rectal exam revealed no abnormalities of the rectum.   The LB [id number]  endoscope was introduced through the anus and advanced to the cecum, which was identified by both the appendix and ileocecal valve. No adverse events experienced.   The quality of the prep was adequate, using MoviPrep  The instrument was then slowly withdrawn as the colon was fully examined.   COLON FINDINGS: A normal appearing cecum, ileocecal valve, and appendiceal orifice were identified.  The ascending, hepatic flexure, transverse, splenic flexure, descending, sigmoid colon and rectum appeared unremarkable.  No polyps or cancers were seen. Retroflexed views revealed internal hemorrhoids. The time to cecum=2 minutes 30 seconds.  Withdrawal time=9 minutes 13 seconds. The scope was withdrawn and the procedure completed. COMPLICATIONS: There were no complications.  ENDOSCOPIC IMPRESSION: Normal colon  RECOMMENDATIONS: repeat Colonoscopy in 5 years.   eSigned:  Meryl Dare, MD, [facility] 03/30/2012 9:35 AM Revised: 03/30/2012 9:35 AM

## 2012-03-31 ENCOUNTER — Telehealth: Payer: Self-pay | Admitting: *Deleted

## 2012-03-31 NOTE — Telephone Encounter (Signed)
  Follow up Call-  Call back number 03/30/2012  Post procedure Call Back phone  # [phone]  Permission to leave phone message Yes     Patient questions:  Do you have a fever, pain , or abdominal swelling? no Pain Score  0 *  Have you tolerated food without any problems? yes  Have you been able to return to your normal activities? yes  Do you have any questions about your discharge instructions: Diet   no Medications  no Follow up visit  no  Do you have questions or concerns about your Care? no  Actions: * If pain score is 4 or above: No action needed, pain <4.

## 2012-04-09 ENCOUNTER — Other Ambulatory Visit: Payer: Self-pay | Admitting: Internal Medicine

## 2012-04-09 NOTE — Telephone Encounter (Signed)
Caller: Laurier/Patient; Patient Name: David Webster; PCP: Eleonore Chiquito [facility]); Best Callback Phone Number: [phone]; Call regarding Lorazepam refill.  Med not noticed in EPIC, per Patient Dr Amador Cunas prescribed, Patient still has bottle.  Last visit on 12-31-11.  Patient feelilng overwhelmed due to finding out his daughter is pregnant and moved her into her own home this past week.  All emergent symptoms ruled out per Anxiety Protocol, see in 72 hours, due to anxiety symptoms increasing.  Patient uses [facility], [place], [phone].

## 2012-04-10 ENCOUNTER — Telehealth: Payer: Self-pay

## 2012-04-10 MED ORDER — LORAZEPAM 0.5 MG PO TABS: 0.5000 mg | ORAL_TABLET | Freq: Two times a day (BID) | ORAL | Status: DC | PRN

## 2012-04-10 NOTE — Telephone Encounter (Signed)
Opened in error

## 2012-04-10 NOTE — Telephone Encounter (Signed)
Called in.

## 2012-04-10 NOTE — Telephone Encounter (Signed)
Last seen 12/2011 - this was on past med list - last written 09/2010 Bid prn -   please advise

## 2012-04-10 NOTE — Telephone Encounter (Signed)
Lorazepam 0.5 #60 one twice a day when necessary refill x1

## 2012-07-01 ENCOUNTER — Other Ambulatory Visit: Payer: Self-pay | Admitting: Internal Medicine

## 2013-01-08 ENCOUNTER — Emergency Department (HOSPITAL_BASED_OUTPATIENT_CLINIC_OR_DEPARTMENT_OTHER)
Admission: EM | Admit: 2013-01-08 | Discharge: 2013-01-08 | Disposition: A | Discharge status: Home / Self Care | Payer: Federal, State, Local not specified - PPO | Attending: Emergency Medicine | Admitting: Emergency Medicine

## 2013-01-08 ENCOUNTER — Emergency Department (HOSPITAL_BASED_OUTPATIENT_CLINIC_OR_DEPARTMENT_OTHER): Payer: Federal, State, Local not specified - PPO

## 2013-01-08 ENCOUNTER — Encounter (HOSPITAL_BASED_OUTPATIENT_CLINIC_OR_DEPARTMENT_OTHER): Payer: Self-pay | Admitting: *Deleted

## 2013-01-08 DIAGNOSIS — S6992XA Unspecified injury of left wrist, hand and finger(s), initial encounter: Secondary | ICD-10-CM

## 2013-01-08 DIAGNOSIS — S59909A Unspecified injury of unspecified elbow, initial encounter: Secondary | ICD-10-CM | POA: Insufficient documentation

## 2013-01-08 DIAGNOSIS — F3289 Other specified depressive episodes: Secondary | ICD-10-CM | POA: Insufficient documentation

## 2013-01-08 DIAGNOSIS — Z23 Encounter for immunization: Secondary | ICD-10-CM | POA: Insufficient documentation

## 2013-01-08 DIAGNOSIS — W1809XA Striking against other object with subsequent fall, initial encounter: Secondary | ICD-10-CM | POA: Insufficient documentation

## 2013-01-08 DIAGNOSIS — S6990XA Unspecified injury of unspecified wrist, hand and finger(s), initial encounter: Secondary | ICD-10-CM | POA: Insufficient documentation

## 2013-01-08 DIAGNOSIS — F17200 Nicotine dependence, unspecified, uncomplicated: Secondary | ICD-10-CM | POA: Insufficient documentation

## 2013-01-08 DIAGNOSIS — Y9389 Activity, other specified: Secondary | ICD-10-CM | POA: Insufficient documentation

## 2013-01-08 DIAGNOSIS — F329 Major depressive disorder, single episode, unspecified: Secondary | ICD-10-CM | POA: Insufficient documentation

## 2013-01-08 DIAGNOSIS — Z79899 Other long term (current) drug therapy: Secondary | ICD-10-CM | POA: Insufficient documentation

## 2013-01-08 DIAGNOSIS — Z8639 Personal history of other endocrine, nutritional and metabolic disease: Secondary | ICD-10-CM | POA: Insufficient documentation

## 2013-01-08 DIAGNOSIS — Z862 Personal history of diseases of the blood and blood-forming organs and certain disorders involving the immune mechanism: Secondary | ICD-10-CM | POA: Insufficient documentation

## 2013-01-08 DIAGNOSIS — F411 Generalized anxiety disorder: Secondary | ICD-10-CM | POA: Insufficient documentation

## 2013-01-08 DIAGNOSIS — W010XXA Fall on same level from slipping, tripping and stumbling without subsequent striking against object, initial encounter: Secondary | ICD-10-CM | POA: Insufficient documentation

## 2013-01-08 DIAGNOSIS — Y9289 Other specified places as the place of occurrence of the external cause: Secondary | ICD-10-CM | POA: Insufficient documentation

## 2013-01-08 MED ORDER — HYDROCODONE-ACETAMINOPHEN 5-325 MG PO TABS: 2.0000 | ORAL_TABLET | ORAL | Status: DC | PRN

## 2013-01-08 MED ORDER — TETANUS-DIPHTH-ACELL PERTUSSIS 5-2.5-18.5 LF-MCG/0.5 IM SUSP
0.5000 mL | Freq: Once | INTRAMUSCULAR | Status: AC
  Administered 2013-01-08: 0.5 mL via INTRAMUSCULAR
  Filled 2013-01-08: qty 0.5

## 2013-01-08 NOTE — ED Notes (Signed)
Left wrist injury. Tripped and fell. Landed on concrete. Abrasion to his left knee.

## 2013-01-08 NOTE — ED Provider Notes (Signed)
History     CSN: [id number]  Arrival date & time 01/08/13  1940   First MD Initiated Contact with Patient 01/08/13 2024      Chief Complaint  Patient presents with  . Hand Injury    (Consider location/radiation/quality/duration/timing/severity/associated sxs/prior treatment) Patient is a 57 y.o. male presenting with hand injury. The history is provided by the patient. No language interpreter was used.  Hand Injury Location:  Wrist Wrist location:  L wrist Pain details:    Quality:  Aching   Radiates to:  Does not radiate Chronicity:  New Foreign body present:  No foreign bodies Tetanus status:  Out of date Pt tripped and landed on hand.  Pt complains of pain in wrist  Past Medical History  Diagnosis Date  . Anxiety   . Depression   . Hyperlipidemia     Past Surgical History  Procedure Laterality Date  . Inguinal hernia repair    . Tonsillectomy    . Colonoscopy      Family History  Problem Relation Age of Onset  . Colon cancer Neg Hx   . Esophageal cancer Neg Hx   . Rectal cancer Neg Hx   . Stomach cancer Neg Hx     History  Substance Use Topics  . Smoking status: Current Some Day Smoker    Types: Cigars  . Smokeless tobacco: Never Used  . Alcohol Use: 2.4 oz/week    4 Cans of beer per week      Review of Systems  Musculoskeletal: Positive for myalgias and joint swelling.  All other systems reviewed and are negative.    Allergies  Sulfacetamide sodium  Home Medications   Current Outpatient Rx  Name  Route  Sig  Dispense  Refill  . ibuprofen (ADVIL,MOTRIN) 800 MG tablet      every 6 (six) hours as needed.          Marland Kitchen LORazepam (ATIVAN) 0.5 MG tablet   Oral   Take 1 tablet (0.5 mg total) by mouth 2 (two) times daily as needed for anxiety.   60 tablet   1   . sertraline (ZOLOFT) 100 MG tablet      TAKE 1 TABLET BY MOUTH EVERY DAY AT 6 PM   90 tablet   1     BP 140/91  Pulse 72  Temp(Src) 98 F (36.7 C) (Oral)  Resp 20  Ht  5\' 11"  (1.803 m)  Wt 184 lb (83.462 kg)  BMI 25.67 kg/m2  SpO2 100%  Physical Exam  Nursing note and vitals reviewed. Constitutional: He is oriented to person, place, and time. He appears well-developed and well-nourished.  Musculoskeletal: He exhibits tenderness.  Tender left wrist, decreased range of motion,   Nv and ns intact  Neurological: He is alert and oriented to person, place, and time. He has normal reflexes.  Skin: Skin is warm.  Psychiatric: He has a normal mood and affect.    ED Course  Procedures (including critical care time)  Labs Reviewed - No data to display Dg Wrist Complete Left  01/08/2013   *RADIOLOGY REPORT*  Clinical Data: Fall, or wrist pain  LEFT WRIST - COMPLETE 3+ VIEW  Comparison: None.  Findings: There is a well corticated fragment along the radial border of the distal radius suggesting a remote fracture to the radial styloid.  No evidence of acute fracture of the distal radius or ulna.  No carpal joint is intact.  The carpal bones appear normal.  IMPRESSION:  1.  No acute fracture of the wrist. 2.  Remote radial styloid fracture.   Original Report Authenticated By: Genevive Bi, M.D.     1. Wrist injury, left, initial encounter       MDM  Pt counseled  I suspect occult fracture.   Pt has seen Dr. Rayburn Ma in the past.   Pt advised to see Dr. Rayburn Ma for recheck in 1 week for repeat xray,   Pt given tetanus,  Hydrocodone and placed in a splint        Elson Areas, PA-C 01/08/13 2103

## 2013-01-09 NOTE — ED Provider Notes (Signed)
Medical screening examination/treatment/procedure(s) were performed by non-physician practitioner and as supervising physician I was immediately available for consultation/collaboration.   Loren Racer, MD 01/09/13 1504

## 2015-04-13 ENCOUNTER — Encounter: Payer: Self-pay | Admitting: Gastroenterology

## 2015-06-10 ENCOUNTER — Ambulatory Visit: Payer: Self-pay | Admitting: Orthopedic Surgery

## 2015-06-10 NOTE — Progress Notes (Signed)
Preoperative surgical orders have been place into the Epic hospital system for Mertie MooresMark A Mierzwa on 06/10/2015, 4:17 PM  by Patrica DuelPERKINS, Andrej Spagnoli for surgery on 06-28-2015.  Preop Knee Scope orders including IV Tylenol and IV Decadron as long as there are no contraindications to the above medications. Avel Peacerew Autrey Human, PA-C

## 2015-07-06 ENCOUNTER — Ambulatory Visit: Payer: Self-pay | Admitting: Orthopedic Surgery

## 2015-07-06 NOTE — Progress Notes (Signed)
Preoperative surgical orders have been place into the Epic hospital system for David Webster on 07/06/2015, 10:25 AM  by Patrica DuelPERKINS, Myalynn Lingle for surgery on 08-02-15.  Preop Knee Scope orders including IV Tylenol and IV Decadron as long as there are no contraindications to the above medications. [name], PA-C

## 2015-07-20 ENCOUNTER — Encounter (HOSPITAL_COMMUNITY): Payer: Self-pay

## 2015-07-20 ENCOUNTER — Encounter (HOSPITAL_COMMUNITY)
Admission: RE | Admit: 2015-07-20 | Discharge: 2015-07-20 | Disposition: A | Discharge status: Home / Self Care | Payer: Federal, State, Local not specified - PPO | Source: Ambulatory Visit | Attending: Orthopedic Surgery | Admitting: Orthopedic Surgery

## 2015-07-20 DIAGNOSIS — Z01818 Encounter for other preprocedural examination: Secondary | ICD-10-CM | POA: Insufficient documentation

## 2015-07-20 HISTORY — DX: Gastro-esophageal reflux disease without esophagitis: K21.9

## 2015-07-20 LAB — CBC
HCT: 42.2 % (ref 39.0–52.0)
Hemoglobin: 14.8 g/dL (ref 13.0–17.0)
MCH: 30.8 pg (ref 26.0–34.0)
MCHC: 35.1 g/dL (ref 30.0–36.0)
MCV: 87.7 fL (ref 78.0–100.0)
PLATELETS: 270 10*3/uL (ref 150–400)
RBC: 4.81 MIL/uL (ref 4.22–5.81)
RDW: 12 % (ref 11.5–15.5)
WBC: 6.8 10*3/uL (ref 4.0–10.5)

## 2015-07-20 NOTE — Progress Notes (Signed)
Clearance L Stephens PA on chart with cbc from 07/09/16

## 2015-07-20 NOTE — Patient Instructions (Addendum)
David Webster  07/20/2015   Your procedure is scheduled on: 08/02/15  Report to [facility]  Entrance take [facility]  elevators to 3rd floor to  Short Stay Center at 2:30 PM Call this number if you have problems the morning of surgery [phone]   Remember: ONLY 1 PERSON MAY GO WITH YOU TO SHORT STAY TO GET  READY MORNING OF YOUR SURGERY.  Do not eat food  :After Midnight. Tuesday NIGHT-- MAY HAVE CLEAR LIQUIDS UNTIL 10:30 AM Wednesday-- THEN NONE     Take these medicines the morning of surgery with A SIP OF WATER: NONE DO NOT TAKE ANY DIABETIC MEDICATIONS DAY OF YOUR SURGERY                               You may not have any metal on your body including hair pins and              piercings  Do not wear jewelry, make-up, lotions, powders or perfumes, deodorant             Do not wear nail polish.  Do not shave  48 hours prior to surgery.              Men may shave face and neck.   Do not bring valuables to the hospital. [place] IS NOT             RESPONSIBLE   FOR VALUABLES.  Contacts, dentures or bridgework may not be worn into surgery.  Leave suitcase in the car. After surgery it may be brought to your room.     Patients discharged the day of surgery will not be allowed to drive home.  Name and phone number of your driver:WIFE  [facility]  Special Instructions: N/A              Please read over the following fact sheets you were given:  _____________________________________________________________________             [facility] - Preparing for Surgery Before surgery, you can play an important role.  Because skin is not sterile, your skin needs to be as free of germs as possible.  You can reduce the number of germs on your skin by washing with CHG (chlorahexidine gluconate) soap before surgery.  CHG is an antiseptic cleaner which kills germs and bonds with the skin to continue killing germs even after washing. Please DO NOT use if you have an allergy to  CHG or antibacterial soaps.  If your skin becomes reddened/irritated stop using the CHG and inform your nurse when you arrive at Short Stay. Do not shave (including legs and underarms) for at least 48 hours prior to the first CHG shower.  You may shave your face/neck. Please follow these instructions carefully:  1.  Shower with CHG Soap the night before surgery and the  morning of Surgery.  2.  If you choose to wash your hair, wash your hair first as usual with your  normal  shampoo.  3.  After you shampoo, rinse your hair and body thoroughly to remove the  shampoo.                           4.  Use CHG as you would any other liquid soap.  You  can apply chg directly  to the skin and wash                       Gently with a scrungie or clean washcloth.  5.  Apply the CHG Soap to your body ONLY FROM THE NECK DOWN.   Do not use on face/ open                           Wound or open sores. Avoid contact with eyes, ears mouth and genitals (private parts).                       Wash face,  Genitals (private parts) with your normal soap.             6.  Wash thoroughly, paying special attention to the area where your surgery  will be performed.  7.  Thoroughly rinse your body with warm water from the neck down.  8.  DO NOT shower/wash with your normal soap after using and rinsing off  the CHG Soap.                9.  Pat yourself dry with a clean towel.            10.  Wear clean pajamas.            11.  Place clean sheets on your bed the night of your first shower and do not  sleep with pets. Day of Surgery : Do not apply any lotions/deodorants the morning of surgery.  Please wear clean clothes to the hospital/surgery center.  FAILURE TO FOLLOW THESE INSTRUCTIONS MAY RESULT IN THE CANCELLATION OF YOUR SURGERY PATIENT SIGNATURE_________________________________  NURSE SIGNATURE__________________________________  ________________________________________________________________________    CLEAR  LIQUID DIET   Foods Allowed                                                                     Foods Excluded  Coffee and tea, regular and decaf                             liquids that you cannot  Plain Jell-O in any flavor                                             see through such as: Fruit ices (not with fruit pulp)                                     milk, soups, orange juice  Iced Popsicles                                    All solid food Carbonated beverages, regular and diet  Cranberry, grape and apple juices Sports drinks like Gatorade Lightly seasoned clear broth or consume(fat free) Sugar, honey syrup  Sample Menu Breakfast                                Lunch                                     Supper Cranberry juice                    Beef broth                            Chicken broth Jell-O                                     Grape juice                           Apple juice Coffee or tea                        Jell-O                                      Popsicle                                                Coffee or tea                        Coffee or tea  _____________________________________________________________________    Incentive Spirometer  An incentive spirometer is a tool that can help keep your lungs clear and active. This tool measures how well you are filling your lungs with each breath. Taking long deep breaths may help reverse or decrease the chance of developing breathing (pulmonary) problems (especially infection) following:  A long period of time when you are unable to move or be active. BEFORE THE PROCEDURE   If the spirometer includes an indicator to show your best effort, your nurse or respiratory therapist will set it to a desired goal.  If possible, sit up straight or lean slightly forward. Try not to slouch.  Hold the incentive spirometer in an upright position. INSTRUCTIONS FOR USE  1. Sit on the  edge of your bed if possible, or sit up as far as you can in bed or on a chair. 2. Hold the incentive spirometer in an upright position. 3. Breathe out normally. 4. Place the mouthpiece in your mouth and seal your lips tightly around it. 5. Breathe in slowly and as deeply as possible, raising the piston or the ball toward the top of the column. 6. Hold your breath for 3-5 seconds or for as long as possible. Allow the piston or ball to fall to the bottom of the column. 7. Remove the mouthpiece from your mouth and breathe out normally. 8. Rest for a few seconds and repeat Steps 1 through 7 at  least 10 times every 1-2 hours when you are awake. Take your time and take a few normal breaths between deep breaths. 9. The spirometer may include an indicator to show your best effort. Use the indicator as a goal to work toward during each repetition. 10. After each set of 10 deep breaths, practice coughing to be sure your lungs are clear. If you have an incision (the cut made at the time of surgery), support your incision when coughing by placing a pillow or rolled up towels firmly against it. Once you are able to get out of bed, walk around indoors and cough well. You may stop using the incentive spirometer when instructed by your caregiver.  RISKS AND COMPLICATIONS  Take your time so you do not get dizzy or light-headed.  If you are in pain, you may need to take or ask for pain medication before doing incentive spirometry. It is harder to take a deep breath if you are having pain. AFTER USE  Rest and breathe slowly and easily.  It can be helpful to keep track of a log of your progress. Your caregiver can provide you with a simple table to help with this. If you are using the spirometer at home, follow these instructions: SEEK MEDICAL CARE IF:   You are having difficultly using the spirometer.  You have trouble using the spirometer as often as instructed.  Your pain medication is not giving enough  relief while using the spirometer.  You develop fever of 100.5 F (38.1 C) or higher. SEEK IMMEDIATE MEDICAL CARE IF:   You cough up bloody sputum that had not been present before.  You develop fever of 102 F (38.9 C) or greater.  You develop worsening pain at or near the incision site. MAKE SURE YOU:   Understand these instructions.  Will watch your condition.  Will get help right away if you are not doing well or get worse. Document Released: 11/18/2006 Document Revised: 09/30/2011 Document Reviewed: 01/19/2007 [facility] Patient Information 2014 [place], [place].   ________________________________________________________________________

## 2015-07-20 NOTE — Progress Notes (Signed)
Healing scab on anterior shin from the "infection"  No reddness or swelling   States is completing pre op antibiotics.

## 2015-08-02 ENCOUNTER — Encounter (HOSPITAL_COMMUNITY)
Admission: RE | Disposition: A | Discharge status: Home / Self Care | Payer: Self-pay | Source: Ambulatory Visit | Attending: Orthopedic Surgery

## 2015-08-02 ENCOUNTER — Ambulatory Visit (HOSPITAL_COMMUNITY): Payer: Federal, State, Local not specified - PPO | Admitting: Registered Nurse

## 2015-08-02 ENCOUNTER — Ambulatory Visit (HOSPITAL_COMMUNITY)
Admission: RE | Admit: 2015-08-02 | Discharge: 2015-08-02 | Disposition: A | Discharge status: Home / Self Care | Payer: Federal, State, Local not specified - PPO | Source: Ambulatory Visit | Attending: Orthopedic Surgery | Admitting: Orthopedic Surgery

## 2015-08-02 ENCOUNTER — Encounter (HOSPITAL_COMMUNITY): Payer: Self-pay | Admitting: Registered Nurse

## 2015-08-02 DIAGNOSIS — E785 Hyperlipidemia, unspecified: Secondary | ICD-10-CM | POA: Diagnosis not present

## 2015-08-02 DIAGNOSIS — M25561 Pain in right knee: Secondary | ICD-10-CM | POA: Diagnosis present

## 2015-08-02 DIAGNOSIS — M23221 Derangement of posterior horn of medial meniscus due to old tear or injury, right knee: Secondary | ICD-10-CM | POA: Insufficient documentation

## 2015-08-02 DIAGNOSIS — K219 Gastro-esophageal reflux disease without esophagitis: Secondary | ICD-10-CM | POA: Diagnosis not present

## 2015-08-02 DIAGNOSIS — Z791 Long term (current) use of non-steroidal anti-inflammatories (NSAID): Secondary | ICD-10-CM | POA: Insufficient documentation

## 2015-08-02 DIAGNOSIS — M2341 Loose body in knee, right knee: Secondary | ICD-10-CM | POA: Diagnosis not present

## 2015-08-02 DIAGNOSIS — Z79899 Other long term (current) drug therapy: Secondary | ICD-10-CM | POA: Insufficient documentation

## 2015-08-02 DIAGNOSIS — S83249A Other tear of medial meniscus, current injury, unspecified knee, initial encounter: Secondary | ICD-10-CM | POA: Diagnosis present

## 2015-08-02 DIAGNOSIS — F17200 Nicotine dependence, unspecified, uncomplicated: Secondary | ICD-10-CM | POA: Insufficient documentation

## 2015-08-02 HISTORY — PX: KNEE ARTHROSCOPY: SHX127

## 2015-08-02 SURGERY — ARTHROSCOPY, KNEE
Anesthesia: General | Site: Knee | Laterality: Right

## 2015-08-02 MED ORDER — ONDANSETRON HCL 4 MG/2ML IJ SOLN
INTRAMUSCULAR | Status: DC | PRN
  Administered 2015-08-02: 4 mg via INTRAVENOUS

## 2015-08-02 MED ORDER — FENTANYL CITRATE (PF) 100 MCG/2ML IJ SOLN: 25.0000 ug | INTRAMUSCULAR | Status: DC | PRN

## 2015-08-02 MED ORDER — FENTANYL CITRATE (PF) 100 MCG/2ML IJ SOLN
INTRAMUSCULAR | Status: DC | PRN
  Administered 2015-08-02: 75 ug via INTRAVENOUS
  Administered 2015-08-02 (×2): 50 ug via INTRAVENOUS
  Administered 2015-08-02: 25 ug via INTRAVENOUS

## 2015-08-02 MED ORDER — KETOROLAC TROMETHAMINE 30 MG/ML IJ SOLN
30.0000 mg | Freq: Once | INTRAMUSCULAR | Status: AC
  Administered 2015-08-02: 30 mg via INTRAVENOUS

## 2015-08-02 MED ORDER — BUPIVACAINE-EPINEPHRINE 0.25% -1:200000 IJ SOLN
INTRAMUSCULAR | Status: DC | PRN
  Administered 2015-08-02: 20 mL

## 2015-08-02 MED ORDER — ACETAMINOPHEN 10 MG/ML IV SOLN
INTRAVENOUS | Status: AC
  Filled 2015-08-02: qty 100

## 2015-08-02 MED ORDER — FENTANYL CITRATE (PF) 100 MCG/2ML IJ SOLN
INTRAMUSCULAR | Status: AC
Start: 2015-08-02 — End: 2015-08-02
  Filled 2015-08-02: qty 2

## 2015-08-02 MED ORDER — LACTATED RINGERS IR SOLN
Status: DC | PRN
  Administered 2015-08-02: 12000 mL

## 2015-08-02 MED ORDER — HYDROCODONE-ACETAMINOPHEN 5-325 MG PO TABS: 1.0000 | ORAL_TABLET | ORAL | Status: DC | PRN

## 2015-08-02 MED ORDER — METHOCARBAMOL 500 MG PO TABS: 500.0000 mg | ORAL_TABLET | Freq: Four times a day (QID) | ORAL | Status: DC

## 2015-08-02 MED ORDER — ACETAMINOPHEN 10 MG/ML IV SOLN: 1000.0000 mg | Freq: Once | INTRAVENOUS | Status: DC

## 2015-08-02 MED ORDER — SODIUM CHLORIDE 0.9 % IV SOLN: INTRAVENOUS | Status: DC

## 2015-08-02 MED ORDER — ACETAMINOPHEN 10 MG/ML IV SOLN
1000.0000 mg | Freq: Once | INTRAVENOUS | Status: AC
  Administered 2015-08-02: 1000 mg via INTRAVENOUS

## 2015-08-02 MED ORDER — LACTATED RINGERS IV SOLN
INTRAVENOUS | Status: DC
  Administered 2015-08-02: 1000 mL via INTRAVENOUS
  Administered 2015-08-02 (×2): via INTRAVENOUS

## 2015-08-02 MED ORDER — PROPOFOL 10 MG/ML IV BOLUS
INTRAVENOUS | Status: AC
  Filled 2015-08-02: qty 20

## 2015-08-02 MED ORDER — DEXAMETHASONE SODIUM PHOSPHATE 10 MG/ML IJ SOLN
10.0000 mg | Freq: Once | INTRAMUSCULAR | Status: AC
  Administered 2015-08-02: 10 mg via INTRAVENOUS

## 2015-08-02 MED ORDER — CEFAZOLIN SODIUM-DEXTROSE 2-3 GM-% IV SOLR
2.0000 g | INTRAVENOUS | Status: AC
  Administered 2015-08-02: 2 g via INTRAVENOUS

## 2015-08-02 MED ORDER — BUPIVACAINE-EPINEPHRINE (PF) 0.25% -1:200000 IJ SOLN
INTRAMUSCULAR | Status: AC
  Filled 2015-08-02: qty 30

## 2015-08-02 MED ORDER — MIDAZOLAM HCL 2 MG/2ML IJ SOLN
INTRAMUSCULAR | Status: AC
  Filled 2015-08-02: qty 2

## 2015-08-02 MED ORDER — PROMETHAZINE HCL 25 MG/ML IJ SOLN: 6.2500 mg | INTRAMUSCULAR | Status: DC | PRN

## 2015-08-02 MED ORDER — FENTANYL CITRATE (PF) 100 MCG/2ML IJ SOLN
INTRAMUSCULAR | Status: AC
  Filled 2015-08-02: qty 2

## 2015-08-02 MED ORDER — MIDAZOLAM HCL 5 MG/5ML IJ SOLN
INTRAMUSCULAR | Status: DC | PRN
  Administered 2015-08-02: 2 mg via INTRAVENOUS

## 2015-08-02 MED ORDER — PROPOFOL 10 MG/ML IV BOLUS
INTRAVENOUS | Status: DC | PRN
  Administered 2015-08-02: 100 mg via INTRAVENOUS
  Administered 2015-08-02: 200 mg via INTRAVENOUS

## 2015-08-02 MED ORDER — METHOCARBAMOL 500 MG PO TABS: 500.0000 mg | ORAL_TABLET | Freq: Four times a day (QID) | ORAL | Status: DC | PRN

## 2015-08-02 MED ORDER — CEFAZOLIN SODIUM-DEXTROSE 2-3 GM-% IV SOLR
INTRAVENOUS | Status: AC
  Filled 2015-08-02: qty 50

## 2015-08-02 MED ORDER — LIDOCAINE HCL (CARDIAC) 20 MG/ML IV SOLN
INTRAVENOUS | Status: DC | PRN
  Administered 2015-08-02: 50 mg via INTRAVENOUS

## 2015-08-02 MED ORDER — KETOROLAC TROMETHAMINE 30 MG/ML IJ SOLN
INTRAMUSCULAR | Status: AC
  Filled 2015-08-02: qty 1

## 2015-08-02 SURGICAL SUPPLY — 27 items
BANDAGE ACE 6X5 VEL STRL LF (GAUZE/BANDAGES/DRESSINGS) ×2 IMPLANT
BLADE 4.2CUDA (BLADE) ×2 IMPLANT
COVER SURGICAL LIGHT HANDLE (MISCELLANEOUS) ×2 IMPLANT
CUFF TOURN SGL QUICK 34 (TOURNIQUET CUFF) ×1
CUFF TRNQT CYL 34X4X40X1 (TOURNIQUET CUFF) ×1 IMPLANT
DRAPE U-SHAPE 47X51 STRL (DRAPES) ×2 IMPLANT
DRSG EMULSION OIL 3X3 NADH (GAUZE/BANDAGES/DRESSINGS) ×2 IMPLANT
DRSG PAD ABDOMINAL 8X10 ST (GAUZE/BANDAGES/DRESSINGS) ×2 IMPLANT
DURAPREP 26ML APPLICATOR (WOUND CARE) ×2 IMPLANT
GAUZE SPONGE 4X4 12PLY STRL (GAUZE/BANDAGES/DRESSINGS) ×4 IMPLANT
GLOVE BIO SURGEON STRL SZ8 (GLOVE) ×2 IMPLANT
GLOVE BIOGEL PI IND STRL 8 (GLOVE) ×1 IMPLANT
GLOVE BIOGEL PI INDICATOR 8 (GLOVE) ×1
GOWN STRL REUS W/TWL LRG LVL3 (GOWN DISPOSABLE) ×2 IMPLANT
KIT BASIN OR (CUSTOM PROCEDURE TRAY) ×2 IMPLANT
MANIFOLD NEPTUNE II (INSTRUMENTS) ×2 IMPLANT
MARKER SKIN DUAL TIP RULER LAB (MISCELLANEOUS) ×2 IMPLANT
PACK ARTHROSCOPY WL (CUSTOM PROCEDURE TRAY) ×2 IMPLANT
PACK ICE MAXI GEL EZY WRAP (MISCELLANEOUS) ×6 IMPLANT
PADDING CAST COTTON 6X4 STRL (CAST SUPPLIES) ×2 IMPLANT
POSITIONER SURGICAL ARM (MISCELLANEOUS) ×2 IMPLANT
SET ARTHROSCOPY TUBING (MISCELLANEOUS) ×1
SET ARTHROSCOPY TUBING LN (MISCELLANEOUS) ×1 IMPLANT
SUT ETHILON 4 0 PS 2 18 (SUTURE) ×2 IMPLANT
TOWEL OR 17X26 10 PK STRL BLUE (TOWEL DISPOSABLE) ×2 IMPLANT
WAND HAND CNTRL MULTIVAC 90 (MISCELLANEOUS) IMPLANT
WRAP KNEE MAXI GEL POST OP (GAUZE/BANDAGES/DRESSINGS) ×2 IMPLANT

## 2015-08-02 NOTE — H&P (Signed)
  CC- David Webster is a 60 y.o. male who presents with right knee pain.  HPI- . Knee Pain: Patient presents with knee pain involving the  right knee. Onset of the symptoms was several months ago. Inciting event: Twisted knee while stepping in a hole. Current symptoms include giving out, pain located medially and swelling. Pain is aggravated by going up and down stairs, lateral movements, pivoting, rising after sitting and walking.  Patient has had no prior knee problems. Evaluation to date: MRI: abnormal medial meniscal tear and loose bodies. Treatment to date: corticosteroid injection which was not very effective.  Past Medical History  Diagnosis Date  . Anxiety   . Depression   . Hyperlipidemia   . GERD (gastroesophageal reflux disease)     Past Surgical History  Procedure Laterality Date  . Inguinal hernia repair Bilateral   . Tonsillectomy    . Colonoscopy    . Rotator cuff repair Right   . Fracture surgery      ankle, wrist    Prior to Admission medications   Medication Sig Start Date End Date Taking? Authorizing Provider  doxycycline (VIBRA-TABS) 100 MG tablet TAKE ONE TABLET (100 MG TOTAL) BY MOUTH 2 (TWO) TIMES DAILY. 07/10/15  Yes Historical Provider, MD  ibuprofen (ADVIL,MOTRIN) 800 MG tablet Take 800 mg by mouth daily as needed (for kneee pain).    Yes Historical Provider, MD  Multiple Vitamin (MULTIVITAMIN WITH MINERALS) TABS tablet Take 1 tablet by mouth daily.   Yes Historical Provider, MD  Multiple Vitamins-Minerals (AIRBORNE PO) Take 1 tablet by mouth daily.   Yes Historical Provider, MD  Omega-3 Fatty Acids (FISH OIL) 1200 MG CAPS Take 1,200 mg by mouth daily.   Yes Historical Provider, MD  vitamin E 400 UNIT capsule Take 400 Units by mouth daily.   Yes Historical Provider, MD   Knee Exam antalgic gait, soft tissue tenderness over medial joint line, no effusion, negative pivot-shift, collateral ligaments intact  Physical Examination: General appearance - alert, well  appearing, and in no distress Mental status - alert, oriented to person, place, and time Chest - clear to auscultation, no wheezes, rales or rhonchi, symmetric air entry Heart - normal rate, regular rhythm, normal S1, S2, no murmurs, rubs, clicks or gallops Abdomen - soft, nontender, nondistended, no masses or organomegaly Neurological - alert, oriented, normal speech, no focal findings or movement disorder noted    Asessment/Plan--- Right knee medial meniscal tear- - Plan right knee arthroscopy with meniscal debridement. Procedure risks and potential comps discussed with patient who elects to proceed. Goals are decreased pain and increased function with a high likelihood of achieving both

## 2015-08-02 NOTE — Interval H&P Note (Signed)
History and Physical Interval Note:  08/02/2015 4:16 PM  David MooresMark A Tassinari  has presented today for surgery, with the diagnosis of RIGHT KNEE LOSE BODIES, MEDIAL MENISCAL TEAR  The various methods of treatment have been discussed with the patient and family. After consideration of risks, benefits and other options for treatment, the patient has consented to  Procedure(s): RIGHT KNEE SCOPE WITH DEBRIDEMENT (Right) as a surgical intervention .  The patient's history has been reviewed, patient examined, no change in status, stable for surgery.  I have reviewed the patient's chart and labs.  Questions were answered to the patient's satisfaction.     Loanne DrillingALUISIO,Zavian Slowey V

## 2015-08-02 NOTE — Transfer of Care (Signed)
Immediate Anesthesia Transfer of Care Note  Patient: David Webster  Procedure(s) Performed: Procedure(s): RIGHT KNEE SCOPE WITH medial mensicus DEBRIDEMENT, removal of multiple loose bodies (Right)  Patient Location: PACU  Anesthesia Type:General  Level of Consciousness: awake, alert , oriented and patient cooperative  Airway & Oxygen Therapy: Patient Spontanous Breathing and Patient connected to face mask oxygen  Post-op Assessment: Report given to RN, Post -op Vital signs reviewed and stable and Patient moving all extremities X 4  Post vital signs: stable  Last Vitals:  Filed Vitals:   08/02/15 1450 08/02/15 1735  BP: 148/98   Pulse: 70 73  Temp: 36.8 C 36.3 C  Resp: 18 10    Complications: No apparent anesthesia complications

## 2015-08-02 NOTE — Op Note (Signed)
Preoperative diagnosis-  Right knee medial meniscal tear  Postoperative diagnosis Right- knee medial meniscal tear plus  Multiple calcified loose bodies  Procedure- Right knee arthroscopy with medial  meniscal debridement and removal of multiple loose bodies   Surgeon- Gus RankinFrank V. Ngozi Alvidrez, MD  Anesthesia-General  EBL-  Minimal  Complications- None  Condition- PACU - hemodynamically stable.  Brief clinical note- -David MooresMark A Webster is a 60 y.o.  male with a several month history of left knee pain and mechanical symptoms. Exam and history suggested medial meniscal tear confirmed by MRI. The patient presents now for arthroscopy and debridement. He also has symptomatic loose bodies in his suprapatellar pouch which are catching and causing mechanical symptoms and presents for removal of those loose bodies.  Procedure in detail -       After successful administration of General anesthetic, a tourmiquet is placed high on the Right  thigh and the Right lower extremity is prepped and draped in the usual sterile fashion. Time out is performed by the surgical team. Standard superomedial and inferolateral portal sites are marked and incisions made with an 11 blade. The inflow cannula is passed through the superomedial portal and camera through the inferolateral portal and inflow is initiated. Arthroscopic visualization proceeds.      The undersurface of the patella and trochlea are visualized and there are Grade II and III changes on each surface. The medial and lateral gutters are visualized and there are no loose bodies there but multiple loose bodies in the suprapatellar pouch.. Flexion and valgus force is applied to the knee and the medial compartment is entered. A spinal needle is passed into the joint through the site marked for the inferomedial portal. A small incision is made and the dilator passed into the joint. The findings for the medial compartment are degenerative tear posterior horn medial meniscus  and 1 x 2 cm area exposed bone medial femoral condyle and smaller lesion medial tibial plateau . The tear is debrided to a stable base with baskets and a shaver and sealed off with the Arthrocare. It is probed and found to be stable. There are no unstable chondral defects.    The intercondylar notch is visualized and the ACL appears normal. The lateral compartment is entered and the findings are normal . I then identified the multiple large loose bodies in the suprapatellar pouch and needed to make a small superolateral incision to access them. With the arthroscopic grasper, I was able to remove them through the superolateral portal. There were a total of 9 calcified loose bodies removed.     The joint is again inspected and there are no other tears, defects or loose bodies identified. The arthroscopic equipment is then removed from the inferior portals which are closed with interrupted 4-0 nylon. 20 ml of .25% Marcaine with epinephrine are injected through the inflow cannula and the cannula is then removed and the portal closed with nylon. The incisions are cleaned and dried and a bulky sterile dressing is applied. The patient is then awakened and transported to recovery in stable condition.   08/02/2015, 5:29 PM

## 2015-08-02 NOTE — Discharge Instructions (Signed)
° °Dr. Frank Aluisio °Total Joint Specialist °[facility] °[address] °[place], [place] [address] °[phone] ° ° °Arthroscopic Procedure, Knee °An arthroscopic procedure can find what is wrong with your knee. °PROCEDURE °Arthroscopy is a surgical technique that allows your orthopedic surgeon to diagnose and treat your knee injury with accuracy. They will look into your knee through a small instrument. This is almost like a small (pencil sized) telescope. Because arthroscopy affects your knee less than open knee surgery, you can anticipate a more rapid recovery. Taking an active role by following your caregiver's instructions will help with rapid and complete recovery. Use crutches, rest, elevation, ice, and knee exercises as instructed. The length of recovery depends on various factors including type of injury, age, physical condition, medical conditions, and your rehabilitation. °Your knee is the joint between the large bones (femur and tibia) in your leg. Cartilage covers these bone ends which are smooth and slippery and allow your knee to bend and move smoothly. Two menisci, thick, semi-lunar shaped pads of cartilage which form a rim inside the joint, help absorb shock and stabilize your knee. Ligaments bind the bones together and support your knee joint. Muscles move the joint, help support your knee, and take stress off the joint itself. Because of this all programs and physical therapy to rehabilitate an injured or repaired knee require rebuilding and strengthening your muscles. °AFTER THE PROCEDURE °· After the procedure, you will be moved to a recovery area until most of the effects of the medication have worn off. Your caregiver will discuss the test results with you.  °· Only take over-the-counter or prescription medicines for pain, discomfort, or fever as directed by your caregiver.  °SEEK MEDICAL CARE IF:  °· You have increased bleeding from your wounds.  °· You see  redness, swelling, or have increasing pain in your wounds.  °· You have pus coming from your wound.  °· You have an oral temperature above 102° F (38.9° C).  °· You notice a bad smell coming from the wound or dressing.  °· You have severe pain with any motion of your knee.  °SEEK IMMEDIATE MEDICAL CARE IF:  °· You develop a rash.  °· You have difficulty breathing.  °· You have any allergic problems.  °FURTHER INSTRUCTIONS:  °· ICE to the affected knee every three hours for 30 minutes at a time and then as needed for pain and swelling.  Continue to use ice on the knee for pain and swelling from surgery. You may notice swelling that will progress down to the foot and ankle.  This is normal after surgery.  Elevate the leg when you are not up walking on it.   ° °DIET °You may resume your previous home diet once your are discharged from the hospital. ° °DRESSING / WOUND CARE / SHOWERING °You may start showering two days after being discharged home but do not submerge the incisions under water.  °Change dressing 48 hours after the procedure and then cover the small incisions with band aids until your follow up visit. °Change the surgical dressings daily and reapply a dry dressing each time.  ° °ACTIVITY °Walk with your walker as instructed. °Use walker as long as suggested by your caregivers. °Avoid periods of inactivity such as sitting longer than an hour when not asleep. This helps prevent blood clots.  °You may resume a sexual relationship in one month or when given the OK by your doctor.  °You may return to   work once you are cleared by your doctor.  °Do not drive a car for 6 weeks or until released by you surgeon.  °Do not drive while taking narcotics. ° °WEIGHT BEARING AS TOLERATED ° °POSTOPERATIVE CONSTIPATION PROTOCOL °Constipation - defined medically as fewer than three stools per week and severe constipation as less than one stool per week. ° °One of the most common issues patients have following surgery is  constipation.  Even if you have a regular bowel pattern at home, your normal regimen is likely to be disrupted due to multiple reasons following surgery.  Combination of anesthesia, postoperative narcotics, change in appetite and fluid intake all can affect your bowels.  In order to avoid complications following surgery, here are some recommendations in order to help you during your recovery period. ° °Colace (docusate) - Pick up an over-the-counter form of Colace or another stool softener and take twice a day as long as you are requiring postoperative pain medications.  Take with a full glass of water daily.  If you experience loose stools or diarrhea, hold the colace until you stool forms back up.  If your symptoms do not get better within 1 week or if they get worse, check with your doctor. ° °Dulcolax (bisacodyl) - Pick up over-the-counter and take as directed by the product packaging as needed to assist with the movement of your bowels.  Take with a full glass of water.  Use this product as needed if not relieved by Colace only.  ° °MiraLax (polyethylene glycol) - Pick up over-the-counter to have on hand.  MiraLax is a solution that will increase the amount of water in your bowels to assist with bowel movements.  Take as directed and can mix with a glass of water, juice, soda, coffee, or tea.  Take if you go more than two days without a movement. °Do not use MiraLax more than once per day. Call your doctor if you are still constipated or irregular after using this medication for 7 days in a row. ° °If you continue to have problems with postoperative constipation, please contact the office for further assistance and recommendations.  If you experience "the worst abdominal pain ever" or develop nausea or vomiting, please contact the office immediatly for further recommendations for treatment. ° °ITCHING ° If you experience itching with your medications, try taking only a single pain pill, or even half a pain pill  at a time.  You can also use Benadryl over the counter for itching or also to help with sleep.  ° °TED HOSE STOCKINGS °Wear the elastic stockings on both legs for three weeks following surgery during the day but you may remove then at night for sleeping. ° °MEDICATIONS °See your medication summary on the “After Visit Summary” that the nursing staff will review with you prior to discharge.  You may have some home medications which will be placed on hold until you complete the course of blood thinner medication.  It is important for you to complete the blood thinner medication as prescribed by your surgeon.  Continue your approved medications as instructed at time of discharge. °Do not drive while taking narcotics.  ° °PRECAUTIONS °If you experience chest pain or shortness of breath - call 911 immediately for transfer to the hospital emergency department.  °If you develop a fever greater that 101 F, purulent drainage from wound, increased redness or drainage from wound, foul odor from the wound/dressing, or calf pain - CONTACT YOUR SURGEON.   °                                                °  FOLLOW-UP APPOINTMENTS °Make sure you keep all of your appointments after your operation with your surgeon and caregivers. You should call the office at [phone]  and make an appointment for approximately one week after the date of your surgery or on the date instructed by your surgeon outlined in the "After Visit Summary". ° °RANGE OF MOTION AND STRENGTHENING EXERCISES  °Rehabilitation of the knee is important following a knee injury or an operation. After just a few days of immobilization, the muscles of the thigh which control the knee become weakened and shrink (atrophy). Knee exercises are designed to build up the tone and strength of the thigh muscles and to improve knee motion. Often times heat used for twenty to thirty minutes before working out will loosen up your tissues and help with improving the range of motion  but do not use heat for the first two weeks following surgery. These exercises can be done on a training (exercise) mat, on the floor, on a table or on a bed. Use what ever works the best and is most comfortable for you Knee exercises include: ° °QUAD STRENGTHENING EXERCISES °Strengthening Quadriceps Sets ° °Tighten muscles on top of thigh by pushing knees down into floor or table. °Hold for 20 seconds. Repeat 10 times. °Do 2 sessions per day. ° ° ° ° °Strengthening Terminal Knee Extension ° °With knee bent over bolster, straighten knee by tightening muscle on top of thigh. Be sure to keep bottom of knee on bolster. °Hold for 20 seconds. Repeat 10 times. °Do 2 sessions per day. ° ° °Straight Leg with Bent Knee ° °Lie on back with opposite leg bent. Keep involved knee slightly bent at knee and raise leg 4-6". Hold for 10 seconds. °Repeat 20 times per set. °Do 2 sets per session. °Do 2 sessions per day. ° ° ° ° °General Anesthesia, Adult, Care After °Refer to this sheet in the next few weeks. These instructions provide you with information on caring for yourself after your procedure. Your health care provider may also give you more specific instructions. Your treatment has been planned according to current medical practices, but problems sometimes occur. Call your health care provider if you have any problems or questions after your procedure. °WHAT TO EXPECT AFTER THE PROCEDURE °After the procedure, it is typical to experience: °· Sleepiness. °· Nausea and vomiting. °HOME CARE INSTRUCTIONS °· For the first 24 hours after general anesthesia: °¨ Have a responsible person with you. °¨ Do not drive a car. If you are alone, do not take public transportation. °¨ Do not drink alcohol. °¨ Do not take medicine that has not been prescribed by your health care provider. °¨ Do not sign important papers or make important decisions. °¨ You may resume a normal diet and activities as directed by your health care provider. °· Change  bandages (dressings) as directed. °· If you have questions or problems that seem related to general anesthesia, call the hospital and ask for the anesthetist or anesthesiologist on call. °SEEK MEDICAL CARE IF: °· You have nausea and vomiting that continue the day after anesthesia. °· You develop a rash. °SEEK IMMEDIATE MEDICAL CARE IF:  °· You have difficulty breathing. °· You have chest pain. °· You have any allergic problems. °  °This information is not intended to replace advice given to you by your health care provider. Make sure you discuss any questions you have with your health care provider. °  °Document Released: 10/14/2000 Document Revised: 07/29/2014 Document Reviewed:   11/06/2011 °Elsevier Interactive Patient Education ©2016 [employer] ° °

## 2015-08-02 NOTE — Anesthesia Preprocedure Evaluation (Signed)
Anesthesia Evaluation  Patient identified by MRN, date of birth, ID band Patient awake    Reviewed: Allergy & Precautions, NPO status , Patient's Chart, lab work & pertinent test results  Airway Mallampati: II  TM Distance: >3 FB Neck ROM: Full    Dental no notable dental hx.    Pulmonary Current Smoker,    Pulmonary exam normal breath sounds clear to auscultation       Cardiovascular negative cardio ROS Normal cardiovascular exam Rhythm:Regular Rate:Normal     Neuro/Psych Anxiety Depression negative neurological ROS     GI/Hepatic negative GI ROS, Neg liver ROS,   Endo/Other  negative endocrine ROS  Renal/GU negative Renal ROS  negative genitourinary   Musculoskeletal negative musculoskeletal ROS (+)   Abdominal   Peds negative pediatric ROS (+)  Hematology negative hematology ROS (+)   Anesthesia Other Findings   Reproductive/Obstetrics negative OB ROS                             Anesthesia Physical Anesthesia Plan  ASA: II  Anesthesia Plan: General   Post-op Pain Management:    Induction: Intravenous  Airway Management Planned: LMA  Additional Equipment:   Intra-op Plan:   Post-operative Plan: Extubation in OR  Informed Consent: I have reviewed the patients History and Physical, chart, labs and discussed the procedure including the risks, benefits and alternatives for the proposed anesthesia with the patient or authorized representative who has indicated his/her understanding and acceptance.   Dental advisory given  Plan Discussed with: CRNA and Surgeon  Anesthesia Plan Comments:         Anesthesia Quick Evaluation

## 2015-08-02 NOTE — Anesthesia Postprocedure Evaluation (Signed)
Anesthesia Post Note  Patient: Mertie MooresMark A Mandel  Procedure(s) Performed: Procedure(s) (LRB): RIGHT KNEE SCOPE WITH medial mensicus DEBRIDEMENT, removal of multiple loose bodies (Right)  Patient location during evaluation: PACU Anesthesia Type: General Level of consciousness: awake and alert Pain management: pain level controlled Vital Signs Assessment: post-procedure vital signs reviewed and stable Respiratory status: spontaneous breathing, nonlabored ventilation, respiratory function stable and patient connected to nasal cannula oxygen Cardiovascular status: blood pressure returned to baseline and stable Postop Assessment: no signs of nausea or vomiting Anesthetic complications: no    Last Vitals:  Filed Vitals:   08/02/15 1745 08/02/15 1800  BP: 150/91 154/93  Pulse: 66 69  Temp:    Resp: 17 16    Last Pain:  Filed Vitals:   08/02/15 1800  PainSc: 0-No pain                 Amaira Safley S

## 2015-08-02 NOTE — Anesthesia Procedure Notes (Signed)
Procedure Name: LMA Insertion Performed by: Kizzie FantasiaARVER, Hao Dion J Pre-anesthesia Checklist: Patient identified, Emergency Drugs available, Patient being monitored, Suction available and Timeout performed Patient Re-evaluated:Patient Re-evaluated prior to inductionOxygen Delivery Method: Circle system utilized Preoxygenation: Pre-oxygenation with 100% oxygen Intubation Type: IV induction Ventilation: Mask ventilation without difficulty LMA: LMA inserted LMA Size: 5.0 Number of attempts: 1 Airway Equipment and Method: Stylet Placement Confirmation: positive ETCO2,  CO2 detector and breath sounds checked- equal and bilateral Tube secured with: Tape Dental Injury: Dental damage

## 2015-08-03 ENCOUNTER — Encounter (HOSPITAL_COMMUNITY): Payer: Self-pay | Admitting: Orthopedic Surgery

## 2015-08-14 NOTE — Addendum Note (Signed)
Addendum  created 08/14/15 0717 by Eilene Ghazi, MD   Modules edited: Anesthesia Responsible Staff

## 2016-02-01 DIAGNOSIS — Z4789 Encounter for other orthopedic aftercare: Secondary | ICD-10-CM | POA: Diagnosis not present

## 2016-02-01 DIAGNOSIS — M23321 Other meniscus derangements, posterior horn of medial meniscus, right knee: Secondary | ICD-10-CM | POA: Diagnosis not present

## 2016-02-01 DIAGNOSIS — M1711 Unilateral primary osteoarthritis, right knee: Secondary | ICD-10-CM | POA: Diagnosis not present

## 2016-02-29 DIAGNOSIS — J069 Acute upper respiratory infection, unspecified: Secondary | ICD-10-CM | POA: Diagnosis not present

## 2016-06-02 ENCOUNTER — Other Ambulatory Visit (INDEPENDENT_AMBULATORY_CARE_PROVIDER_SITE_OTHER): Payer: Self-pay | Admitting: Orthopaedic Surgery

## 2016-06-03 NOTE — Telephone Encounter (Signed)
Please advise 

## 2016-07-12 ENCOUNTER — Other Ambulatory Visit (INDEPENDENT_AMBULATORY_CARE_PROVIDER_SITE_OTHER): Payer: Self-pay

## 2016-07-12 MED ORDER — IBUPROFEN 800 MG PO TABS: 800.0000 mg | ORAL_TABLET | Freq: Three times a day (TID) | ORAL | 0 refills | Status: DC | PRN

## 2016-10-10 ENCOUNTER — Other Ambulatory Visit (INDEPENDENT_AMBULATORY_CARE_PROVIDER_SITE_OTHER): Payer: Self-pay | Admitting: Orthopaedic Surgery

## 2016-10-10 NOTE — Telephone Encounter (Signed)
Please advise. Thanks.  

## 2017-01-29 DIAGNOSIS — F411 Generalized anxiety disorder: Secondary | ICD-10-CM | POA: Diagnosis not present

## 2017-02-16 ENCOUNTER — Emergency Department (HOSPITAL_COMMUNITY)
Admission: EM | Admit: 2017-02-16 | Discharge: 2017-02-16 | Disposition: A | Discharge status: Home / Self Care | Payer: Federal, State, Local not specified - PPO | Attending: Emergency Medicine | Admitting: Emergency Medicine

## 2017-02-16 ENCOUNTER — Emergency Department (HOSPITAL_COMMUNITY): Payer: Federal, State, Local not specified - PPO

## 2017-02-16 ENCOUNTER — Encounter (HOSPITAL_COMMUNITY): Payer: Self-pay

## 2017-02-16 DIAGNOSIS — Z79899 Other long term (current) drug therapy: Secondary | ICD-10-CM | POA: Insufficient documentation

## 2017-02-16 DIAGNOSIS — R9431 Abnormal electrocardiogram [ECG] [EKG]: Secondary | ICD-10-CM | POA: Diagnosis not present

## 2017-02-16 DIAGNOSIS — F419 Anxiety disorder, unspecified: Secondary | ICD-10-CM | POA: Diagnosis not present

## 2017-02-16 DIAGNOSIS — R0789 Other chest pain: Secondary | ICD-10-CM | POA: Diagnosis not present

## 2017-02-16 DIAGNOSIS — F17290 Nicotine dependence, other tobacco product, uncomplicated: Secondary | ICD-10-CM | POA: Diagnosis not present

## 2017-02-16 DIAGNOSIS — F329 Major depressive disorder, single episode, unspecified: Secondary | ICD-10-CM | POA: Insufficient documentation

## 2017-02-16 DIAGNOSIS — R079 Chest pain, unspecified: Secondary | ICD-10-CM | POA: Diagnosis not present

## 2017-02-16 DIAGNOSIS — F410 Panic disorder [episodic paroxysmal anxiety] without agoraphobia: Secondary | ICD-10-CM

## 2017-02-16 LAB — CBC
HEMATOCRIT: 41.7 % (ref 39.0–52.0)
Hemoglobin: 14.8 g/dL (ref 13.0–17.0)
MCH: 30.3 pg (ref 26.0–34.0)
MCHC: 35.5 g/dL (ref 30.0–36.0)
MCV: 85.3 fL (ref 78.0–100.0)
PLATELETS: 253 10*3/uL (ref 150–400)
RBC: 4.89 MIL/uL (ref 4.22–5.81)
RDW: 12.1 % (ref 11.5–15.5)
WBC: 7 10*3/uL (ref 4.0–10.5)

## 2017-02-16 LAB — I-STAT TROPONIN, ED: Troponin i, poc: 0 ng/mL (ref 0.00–0.08)

## 2017-02-16 LAB — BASIC METABOLIC PANEL
Anion gap: 10 (ref 5–15)
BUN: 27 mg/dL — AB (ref 6–20)
CHLORIDE: 106 mmol/L (ref 101–111)
CO2: 20 mmol/L — ABNORMAL LOW (ref 22–32)
CREATININE: 1.1 mg/dL (ref 0.61–1.24)
Calcium: 9.3 mg/dL (ref 8.9–10.3)
GFR calc Af Amer: >60 mL/min (ref >60)
GLUCOSE: 106 mg/dL — AB (ref 65–99)
Potassium: 3.4 mmol/L — ABNORMAL LOW (ref 3.5–5.1)
Sodium: 136 mmol/L (ref 135–145)

## 2017-02-16 MED ORDER — MELATONIN 3 MG PO CAPS: 3.0000 mg | ORAL_CAPSULE | Freq: Every evening | ORAL | 0 refills | Status: DC | PRN

## 2017-02-16 NOTE — ED Triage Notes (Signed)
Pt. Here for anxiety and chest pain. Pt. Has been having a lot of stress in his life related to his daughter. Seen two weeks ago and given xanax 0.5 mg every night for bed. Pt. Has not been sleeping the last few nights even with the medication. Pt. Had episode of CP and SOB, took 1mg  xanax, and called EMS today. Pt. States that once the pill kicked in he felt really dizzy and got scared. Pt. Obviously anxious during triage. Pt. Vitals WDL.

## 2017-02-16 NOTE — ED Notes (Signed)
Patient Alert and oriented X4. Stable and ambulatory. Patient verbalized understanding of the discharge instructions.  Patient belongings were taken by the patient.  

## 2017-02-16 NOTE — ED Notes (Signed)
Pt. Ambulated to restroom with steady gait. RN stand-by.

## 2017-02-16 NOTE — ED Notes (Signed)
Patient transported to X-ray 

## 2017-02-16 NOTE — ED Notes (Signed)
Pt stood and ambulated 200 ft w/o assistance. NAD noted.

## 2017-02-17 NOTE — ED Provider Notes (Signed)
[facility]-EMERGENCY DEPT Provider Note   CSN: [id number] Arrival date & time: 02/16/17  1800     History   Chief Complaint Chief Complaint  Patient presents with  . Anxiety  . Chest Pain    HPI David Webster is a 61 y.o. male.   Anxiety  This is a recurrent problem. The current episode started 1 to 2 hours ago. The problem occurs constantly. The problem has been gradually improving. Associated symptoms include chest pain and shortness of breath. Nothing aggravates the symptoms. Relieved by: time and xanax. The treatment provided moderate relief.    Past Medical History:  Diagnosis Date  . Anxiety   . Depression   . GERD (gastroesophageal reflux disease)   . Hyperlipidemia     Patient Active Problem List   Diagnosis Date Noted  . Acute medial meniscal tear 08/02/2015  . INSOMNIA 03/29/2010  . HYPERLIPIDEMIA 02/13/2007  . ANXIETY 02/13/2007  . DEPRESSION 02/13/2007    Past Surgical History:  Procedure Laterality Date  . COLONOSCOPY    . FRACTURE SURGERY     ankle, wrist  . INGUINAL HERNIA REPAIR Bilateral   . KNEE ARTHROSCOPY Right 08/02/2015   Procedure: RIGHT KNEE SCOPE WITH medial mensicus DEBRIDEMENT, removal of multiple loose bodies;  Surgeon: Ollen GrossFrank Aluisio, MD;  Location: [facility];  Service: Orthopedics;  Laterality: Right;  . ROTATOR CUFF REPAIR Right   . TONSILLECTOMY         Home Medications    Prior to Admission medications   Medication Sig Start Date End Date Taking? Authorizing Provider  ALPRAZolam Prudy Feeler(XANAX) 0.5 MG tablet Take 0.25-0.5 mg by mouth 2 (two) times daily as needed for anxiety or sleep. 01/29/17  Yes [provider]  buPROPion (WELLBUTRIN XL) 150 MG 24 hr tablet Take 150 mg by mouth daily. 01/29/17  Yes [provider]  ibuprofen (ADVIL,MOTRIN) 800 MG tablet TAKE 1 TABLET (800 MG TOTAL) BY MOUTH EVERY 8 (EIGHT) HOURS AS NEEDED FOR MODERATE PAIN. 10/10/16  Yes Kathryne HitchBlackman, Christopher Y, MD  Multiple Vitamin (MULTIVITAMIN WITH  MINERALS) TABS tablet Take 1 tablet by mouth daily.   Yes [provider]  Omega-3 Fatty Acids (FISH OIL) 1200 MG CAPS Take 1,200 mg by mouth daily.   Yes [provider]  vitamin E 400 UNIT capsule Take 400 Units by mouth daily.   Yes [provider]  Melatonin 3 MG CAPS Take 1 capsule (3 mg total) by mouth at bedtime as needed. 02/16/17   [name], [name], MD    Family History Family History  Problem Relation Age of Onset  . Colon cancer Neg Hx   . Esophageal cancer Neg Hx   . Rectal cancer Neg Hx   . Stomach cancer Neg Hx     Social History Social History  Substance Use Topics  . Smoking status: Current Some Day Smoker    Types: Cigars  . Smokeless tobacco: Never Used     Comment: 07/20/15- none x 3 weeks  . Alcohol use 2.4 oz/week    4 Cans of beer per week     Allergies   Sulfur   Review of Systems Review of Systems  Respiratory: Positive for shortness of breath.   Cardiovascular: Positive for chest pain.  All other systems reviewed and are negative.    Physical Exam Updated Vital Signs BP (!) 147/100   Pulse 80   Temp 98.8 F (37.1 C) (Oral)   Resp 16   Ht 5\' 11"  (1.803 m)  Wt 79.4 kg (175 lb)   SpO2 95%   BMI 24.41 kg/m   Physical Exam  Constitutional: He is oriented to person, place, and time. He appears well-developed and well-nourished.  HENT:  Head: Normocephalic and atraumatic.  Eyes: Conjunctivae and EOM are normal.  Neck: Normal range of motion.  Cardiovascular: Normal rate.   Pulmonary/Chest: Effort normal. No respiratory distress.  Abdominal: Soft. He exhibits no distension.  Musculoskeletal: Normal range of motion. He exhibits no edema or deformity.  Neurological: He is alert and oriented to person, place, and time. No cranial nerve deficit. Coordination normal.  Skin: Skin is warm and dry.  Nursing note and vitals reviewed.    ED Treatments / Results  Labs (all labs ordered are listed, but only abnormal  results are displayed) Labs Reviewed  BASIC METABOLIC PANEL - Abnormal; Notable for the following:       Result Value   Potassium 3.4 (*)    CO2 20 (*)    Glucose, Bld 106 (*)    BUN 27 (*)    All other components within normal limits  CBC  I-STAT TROPONIN, ED    EKG  EKG Interpretation  Date/Time:  Sunday February 16 2017 18:15:12 EDT Ventricular Rate:  74 PR Interval:    QRS Duration: 98 QT Interval:  395 QTC Calculation: 439 R Axis:   14 Text Interpretation:  Sinus rhythm No old tracing to compare Confirmed by Marily MemosMesner, Jaimy Kliethermes [phone]([id number]) on 02/16/2017 6:39:39 PM       Radiology Dg Chest 2 View  Result Date: 02/16/2017 CLINICAL DATA:  61 year old male with chest pain. EXAM: CHEST  2 VIEW COMPARISON:  None. FINDINGS: The lungs are clear. There is no pleural effusion or pneumothorax. The cardiac silhouette is within normal limits. No acute osseous pathology. Degenerative changes of the right shoulder with elevation of the right humeral head, likely related to chronic rotator cuff injury. IMPRESSION: No active cardiopulmonary disease. Electronically Signed   By: Elgie CollardArash  Radparvar M.D.   On: 02/16/2017 19:51    Procedures Procedures (including critical care time)  Medications Ordered in ED Medications - No data to display   Initial Impression / Assessment and Plan / ED Course  I have reviewed the triage vital signs and the nursing notes.  Pertinent labs & imaging results that were available during my care of the patient were reviewed by me and considered in my medical decision making (see chart for details).     Patient with a self-described panic attack earlier today. Patient worked up for possible cardiac or metabolic issues and this was negative. EKG was fine. Troponin was fine and his electrolytes were okay. Monitor for a few hours here without any evidence of arrhythmias. Asymptomatic for a couple hours prior to discharge. plan for continued PCP follow-up and also started on  melatonin for sleep issues.    Final Clinical Impressions(s) / ED Diagnoses   Final diagnoses:  Anxiety attack    New Prescriptions Discharge Medication List as of 02/16/2017 11:15 PM    START taking these medications   Details  Melatonin 3 MG CAPS Take 1 capsule (3 mg total) by mouth at bedtime as needed., Starting Sun 02/16/2017, Print         Nahlia Hellmann, [name], MD 02/17/17 [id number]

## 2017-02-19 DIAGNOSIS — R069 Unspecified abnormalities of breathing: Secondary | ICD-10-CM | POA: Diagnosis not present

## 2017-02-19 DIAGNOSIS — F411 Generalized anxiety disorder: Secondary | ICD-10-CM | POA: Diagnosis not present

## 2017-02-19 DIAGNOSIS — M1711 Unilateral primary osteoarthritis, right knee: Secondary | ICD-10-CM | POA: Diagnosis not present

## 2017-02-26 DIAGNOSIS — Z1329 Encounter for screening for other suspected endocrine disorder: Secondary | ICD-10-CM | POA: Diagnosis not present

## 2017-02-26 DIAGNOSIS — Z136 Encounter for screening for cardiovascular disorders: Secondary | ICD-10-CM | POA: Diagnosis not present

## 2017-02-26 DIAGNOSIS — Z0000 Encounter for general adult medical examination without abnormal findings: Secondary | ICD-10-CM | POA: Diagnosis not present

## 2017-02-26 DIAGNOSIS — Z125 Encounter for screening for malignant neoplasm of prostate: Secondary | ICD-10-CM | POA: Diagnosis not present

## 2017-02-26 DIAGNOSIS — Z131 Encounter for screening for diabetes mellitus: Secondary | ICD-10-CM | POA: Diagnosis not present

## 2017-02-28 DIAGNOSIS — M1711 Unilateral primary osteoarthritis, right knee: Secondary | ICD-10-CM | POA: Diagnosis not present

## 2017-03-07 DIAGNOSIS — M1711 Unilateral primary osteoarthritis, right knee: Secondary | ICD-10-CM | POA: Diagnosis not present

## 2017-05-06 ENCOUNTER — Encounter: Payer: Self-pay | Admitting: Gastroenterology

## 2017-05-13 DIAGNOSIS — F411 Generalized anxiety disorder: Secondary | ICD-10-CM | POA: Diagnosis not present

## 2017-06-24 DIAGNOSIS — F332 Major depressive disorder, recurrent severe without psychotic features: Secondary | ICD-10-CM | POA: Diagnosis not present

## 2017-09-16 DIAGNOSIS — F332 Major depressive disorder, recurrent severe without psychotic features: Secondary | ICD-10-CM | POA: Diagnosis not present

## 2017-10-15 DIAGNOSIS — F331 Major depressive disorder, recurrent, moderate: Secondary | ICD-10-CM | POA: Diagnosis not present

## 2017-11-25 ENCOUNTER — Ambulatory Visit: Payer: Federal, State, Local not specified - PPO | Admitting: Neurology

## 2017-11-25 ENCOUNTER — Encounter: Payer: Self-pay | Admitting: Neurology

## 2017-11-25 DIAGNOSIS — R413 Other amnesia: Secondary | ICD-10-CM

## 2017-11-25 HISTORY — DX: Other amnesia: R41.3

## 2017-11-25 NOTE — Progress Notes (Signed)
Reason for visit: Memory disturbance  Referring physician: Dr. Tinnie Gens is a 62 y.o. male  History of present illness:  David Webster is a 62 year old right-handed white male with a history of anxiety and depression, currently followed through psychiatry.  The patient comes into the office today with his wife.  By history, the patient has had a fairly dramatic change in his personality over the last 10 months.  He typically has been a very social, outgoing person, the "life of the party".  The patient had an argument with his daughter in July 2018, since that time, the wife has noted a change in his behavior.  He has become more withdrawn, he is more anxious and nervous, he is unsure of himself.  He has separation anxiety when his wife leaves to go to work.  The patient will repeat himself frequently, he will ask the same questions again and again.  The patient has short-term memory issues, he denies any problems with memory or names for people or things.  He has given up driving for the most part as he feels anxious and nervous and unsure of himself with driving.  There have been no definite problems with directions with driving or safety with driving.  He will cut the grass but only when his wife is at home with him.  The patient otherwise is not working, he stays at home watching TV throughout the day, he does very little.  He gets nervous when people come over the visit.  The patient is unable to follow directions well, he cannot go to the grocery store by himself.  He claims that he sleeps fairly well at night, he has a good energy level during the day.  He reports no numbness or weakness of the face, arms, or legs.  He has no problems controlling the bowels or the bladder.  He indicates that a paternal grandmother had some memory problems but otherwise no family history of memory disorders.  The patient requires some assistance keeping up with medications and appointments.  He indicates  that his wife does the finances and always has.  He is sent to this office for an evaluation.  Past Medical History:  Diagnosis Date  . Anxiety   . Depression   . GERD (gastroesophageal reflux disease)   . Hyperlipidemia   . Memory difficulty 11/25/2017    Past Surgical History:  Procedure Laterality Date  . COLONOSCOPY    . FRACTURE SURGERY     ankle, wrist  . INGUINAL HERNIA REPAIR Bilateral   . KNEE ARTHROSCOPY Right 08/02/2015   Procedure: RIGHT KNEE SCOPE WITH medial mensicus DEBRIDEMENT, removal of multiple loose bodies;  Surgeon: Ollen Gross, MD;  Location: [facility];  Service: Orthopedics;  Laterality: Right;  . ROTATOR CUFF REPAIR Right   . TONSILLECTOMY      Family History  Problem Relation Age of Onset  . Depression Mother   . Anxiety disorder Mother   . Stroke Father   . Dementia Paternal Grandmother   . Colon cancer Neg Hx   . Esophageal cancer Neg Hx   . Rectal cancer Neg Hx   . Stomach cancer Neg Hx     Social history:  reports that he has quit smoking. His smoking use included cigars. He has never used smokeless tobacco. He reports that he drinks about 2.4 oz of alcohol per week. He reports that he does not use drugs.  Medications:  Prior to Admission medications  Medication Sig Start Date End Date Taking? Authorizing Provider  ALPRAZolam Prudy Feeler) 0.5 MG tablet Take 0.25-0.5 mg by mouth 2 (two) times daily as needed for anxiety or sleep. 01/29/17  Yes [provider]  ibuprofen (ADVIL,MOTRIN) 800 MG tablet TAKE 1 TABLET (800 MG TOTAL) BY MOUTH EVERY 8 (EIGHT) HOURS AS NEEDED FOR MODERATE PAIN. 10/10/16  Yes Kathryne Hitch, MD  Multiple Vitamin (MULTIVITAMIN WITH MINERALS) TABS tablet Take 1 tablet by mouth daily.   Yes [provider]  Omega-3 Fatty Acids (FISH OIL) 1200 MG CAPS Take 1,200 mg by mouth daily.   Yes [provider]  TRINTELLIX 20 MG TABS tablet Take 20 mg by mouth daily. 11/17/17  Yes [provider]    vitamin E 400 UNIT capsule Take 400 Units by mouth daily.   Yes [provider]      Allergies  Allergen Reactions  . Sulfur Other (See Comments)    Patient never had sulfa drug products but grandmother died from sulfur allergy. Patient does not want to take drugs with sulfur    ROS:  Out of a complete 14 system review of symptoms, the patient complains only of the following symptoms, and all other reviewed systems are negative.  Confusion Tremors Depression, anxiety, not enough sleep, decreased energy, change in appetite, disinterest in activities Insomnia, sleepiness  Blood pressure 135/80, pulse 62, height  (1.803 m), weight 173 lb 8 oz (78.7 kg).  Physical Exam  General: The patient is alert and cooperative at the time of the examination.  Eyes: Pupils are equal, round, and reactive to light. Discs are flat bilaterally.  Neck: The neck is supple, no carotid bruits are noted.  Respiratory: The respiratory examination is clear.  Cardiovascular: The cardiovascular examination reveals a regular rate and rhythm, no obvious murmurs or rubs are noted.  Skin: Extremities are without significant edema.  Neurologic Exam  Mental status: The patient is alert and oriented x 3 at the time of the examination. The patient has apparent normal recent and remote memory, with an apparently normal attention span and concentration ability.  Mini-Mental status examination done today shows a total score 28/30.  Cranial nerves: Facial symmetry is present. There is good sensation of the face to pinprick and soft touch bilaterally. The strength of the facial muscles and the muscles to head turning and shoulder shrug are normal bilaterally. Speech is well enunciated, no aphasia or dysarthria is noted. Extraocular movements are full. Visual fields are full. The tongue is midline, and the patient has symmetric elevation of the soft palate. No obvious hearing deficits are  noted.  Motor: The motor testing reveals 5 over 5 strength of all 4 extremities. Good symmetric motor tone is noted throughout.  Sensory: Sensory testing is intact to pinprick, soft touch, vibration sensation, and position sense on all 4 extremities. No evidence of extinction is noted.  Coordination: Cerebellar testing reveals good finger-nose-finger and heel-to-shin bilaterally.  Gait and station: Gait is normal. Tandem gait is normal. Romberg is negative. No drift is seen.  Reflexes: Deep tendon reflexes are symmetric and normal bilaterally. Toes are downgoing bilaterally.   Assessment/Plan:  1.  Memory/cognitive disturbance  2.  Anxiety and depression  The patient is reporting quite a bit of anxiety, he has had a significant change in personality over the last 10 months, he is now less social, more anxious, less sure of himself.  The patient may have an organic dementing illness such as a frontotemporal dementia or  Alzheimer's disease.  Given the underlying anxiety and depression, the patient will be sent for neuropsychological evaluation.  He will have MRI of the brain and blood work done today.  We may consider medication such as Aricept in the future, he will follow-up in 6 months.  The wife believes that the changes in behavior and memory have been progressive.  Marlan Palau MD 11/25/2017 12:02 PM  [facility] [address] [place], [place] [address]  Phone [phone] Fax [phone]

## 2017-11-25 NOTE — Patient Instructions (Signed)
   We will get blood work and get MRI of the brain.   We will get Neuropsychological testing.

## 2017-11-26 ENCOUNTER — Encounter: Payer: Self-pay | Admitting: Psychology

## 2017-11-26 ENCOUNTER — Telehealth: Payer: Self-pay | Admitting: Neurology

## 2017-11-26 ENCOUNTER — Telehealth: Payer: Self-pay | Admitting: *Deleted

## 2017-11-26 LAB — TSH: TSH: 2.34 u[IU]/mL (ref 0.450–4.500)

## 2017-11-26 LAB — RPR: RPR Ser Ql: NONREACTIVE

## 2017-11-26 LAB — SEDIMENTATION RATE: SED RATE: 2 mm/h (ref 0–30)

## 2017-11-26 LAB — VITAMIN B12: VITAMIN B 12: 687 pg/mL (ref 232–1245)

## 2017-11-26 LAB — HIV ANTIBODY (ROUTINE TESTING W REFLEX): HIV SCREEN 4TH GENERATION: NONREACTIVE

## 2017-11-26 NOTE — Telephone Encounter (Signed)
Called and spoke w/ wife Tammy about unremarkable labs per CW,MD note. She verbalized understanding.

## 2017-11-26 NOTE — Telephone Encounter (Signed)
I spoke to the patient he is scheduled for 12/03/17 at the [facility] mobile unit.

## 2017-11-26 NOTE — Telephone Encounter (Signed)
Patient is returning your call.  

## 2017-11-26 NOTE — Telephone Encounter (Signed)
[employer] auth: NPR Ref # [id number]  lvm for pt to call back to schedule mri

## 2017-11-26 NOTE — Telephone Encounter (Signed)
-----   Message from York Spaniel, MD sent at 11/26/2017  4:34 PM EDT -----  The blood work results are unremarkable. Please call the patient.  ----- Message ----- From: Nell Range Lab Results In Sent: 11/26/2017   5:40 AM To: York Spaniel, MD

## 2017-12-03 ENCOUNTER — Ambulatory Visit: Payer: Federal, State, Local not specified - PPO

## 2017-12-03 DIAGNOSIS — R413 Other amnesia: Secondary | ICD-10-CM

## 2017-12-04 ENCOUNTER — Telehealth: Payer: Self-pay | Admitting: Neurology

## 2017-12-04 NOTE — Telephone Encounter (Signed)
I called the patient. The MRI of the brain is normal. We will get Neuropsychological testing done.   MRI brain 12/04/17:  IMPRESSION:   Normal MRI brain (without).

## 2017-12-10 DIAGNOSIS — F341 Dysthymic disorder: Secondary | ICD-10-CM | POA: Diagnosis not present

## 2018-02-03 DIAGNOSIS — F23 Brief psychotic disorder: Secondary | ICD-10-CM | POA: Diagnosis not present

## 2018-02-03 DIAGNOSIS — F332 Major depressive disorder, recurrent severe without psychotic features: Secondary | ICD-10-CM | POA: Diagnosis not present

## 2018-04-09 DIAGNOSIS — F411 Generalized anxiety disorder: Secondary | ICD-10-CM | POA: Diagnosis not present

## 2018-04-09 DIAGNOSIS — F3342 Major depressive disorder, recurrent, in full remission: Secondary | ICD-10-CM | POA: Diagnosis not present

## 2018-05-05 ENCOUNTER — Encounter: Payer: Federal, State, Local not specified - PPO | Admitting: Psychology

## 2018-06-02 ENCOUNTER — Encounter: Payer: Federal, State, Local not specified - PPO | Admitting: Psychology

## 2018-06-03 ENCOUNTER — Telehealth: Payer: Self-pay | Admitting: Neurology

## 2018-06-03 ENCOUNTER — Ambulatory Visit: Payer: Federal, State, Local not specified - PPO | Admitting: Neurology

## 2018-06-03 NOTE — Telephone Encounter (Signed)
This patient did not show for a revisit appointment today. 

## 2018-06-04 ENCOUNTER — Encounter: Payer: Self-pay | Admitting: Neurology

## 2018-09-29 ENCOUNTER — Telehealth: Payer: Self-pay | Admitting: Neurology

## 2018-09-29 NOTE — Telephone Encounter (Signed)
Pt wife(on DPR) is asking for a call from RN to confirm if the cognitive testing needs to be done before upcoming 6 mo f/u

## 2018-09-29 NOTE — Telephone Encounter (Signed)
I spoke with Dr. Anne Hahn verbally in regards to this message. He states it would be appropriate to cancel appt for 10/07/18 and have pt reschedule once neuro psych testing is completed.  I contacted the pt's wife and advised of MD's recommendations.  Appt cancelled and pt will call back once neuro psych testing is completed to reschedule f/u.

## 2018-09-29 NOTE — Telephone Encounter (Signed)
I contacted David Webster. She states the pt has been scheduled for 12/27/18 for his Neuropsychological therapy. Wife states the pt was previously scheduled in Nov of 2019 but cancelled. She states the pt has no new concerns/issues and wanted to know if Dr. Anne Hahn would be agreeable with waiting for f/u to be scheduled after the neuro testing. I advised David Webster that I would speak with Dr. Anne Hahn about this and call her back. David Webster was agreeable.

## 2018-10-07 ENCOUNTER — Ambulatory Visit: Payer: Federal, State, Local not specified - PPO | Admitting: Neurology

## 2018-10-20 ENCOUNTER — Encounter: Payer: Federal, State, Local not specified - PPO | Admitting: Psychology

## 2018-10-20 ENCOUNTER — Encounter

## 2018-12-29 DIAGNOSIS — R413 Other amnesia: Secondary | ICD-10-CM | POA: Diagnosis not present

## 2019-02-12 DIAGNOSIS — F332 Major depressive disorder, recurrent severe without psychotic features: Secondary | ICD-10-CM | POA: Diagnosis not present

## 2019-02-12 DIAGNOSIS — F411 Generalized anxiety disorder: Secondary | ICD-10-CM | POA: Diagnosis not present

## 2019-10-25 DIAGNOSIS — H6502 Acute serous otitis media, left ear: Secondary | ICD-10-CM | POA: Diagnosis not present

## 2019-11-26 DIAGNOSIS — H6982 Other specified disorders of Eustachian tube, left ear: Secondary | ICD-10-CM | POA: Diagnosis not present

## 2019-12-24 DIAGNOSIS — H903 Sensorineural hearing loss, bilateral: Secondary | ICD-10-CM | POA: Diagnosis not present

## 2020-01-17 DIAGNOSIS — Z131 Encounter for screening for diabetes mellitus: Secondary | ICD-10-CM | POA: Diagnosis not present

## 2020-01-17 DIAGNOSIS — Z23 Encounter for immunization: Secondary | ICD-10-CM | POA: Diagnosis not present

## 2020-01-17 DIAGNOSIS — Z136 Encounter for screening for cardiovascular disorders: Secondary | ICD-10-CM | POA: Diagnosis not present

## 2020-01-17 DIAGNOSIS — Z13220 Encounter for screening for lipoid disorders: Secondary | ICD-10-CM | POA: Diagnosis not present

## 2020-01-17 DIAGNOSIS — Z0000 Encounter for general adult medical examination without abnormal findings: Secondary | ICD-10-CM | POA: Diagnosis not present

## 2020-01-17 DIAGNOSIS — Z125 Encounter for screening for malignant neoplasm of prostate: Secondary | ICD-10-CM | POA: Diagnosis not present

## 2020-01-19 DIAGNOSIS — R419 Unspecified symptoms and signs involving cognitive functions and awareness: Secondary | ICD-10-CM | POA: Diagnosis not present

## 2021-10-17 ENCOUNTER — Other Ambulatory Visit: Payer: Self-pay

## 2021-10-17 ENCOUNTER — Emergency Department (HOSPITAL_BASED_OUTPATIENT_CLINIC_OR_DEPARTMENT_OTHER)
Admission: EM | Admit: 2021-10-17 | Discharge: 2021-10-17 | Disposition: A | Discharge status: Home / Self Care | Payer: Medicare Other | Attending: Emergency Medicine | Admitting: Emergency Medicine

## 2021-10-17 ENCOUNTER — Encounter (HOSPITAL_BASED_OUTPATIENT_CLINIC_OR_DEPARTMENT_OTHER): Payer: Self-pay

## 2021-10-17 ENCOUNTER — Emergency Department (HOSPITAL_BASED_OUTPATIENT_CLINIC_OR_DEPARTMENT_OTHER): Payer: Medicare Other

## 2021-10-17 DIAGNOSIS — M519 Unspecified thoracic, thoracolumbar and lumbosacral intervertebral disc disorder: Secondary | ICD-10-CM

## 2021-10-17 DIAGNOSIS — R109 Unspecified abdominal pain: Secondary | ICD-10-CM | POA: Diagnosis present

## 2021-10-17 DIAGNOSIS — N400 Enlarged prostate without lower urinary tract symptoms: Secondary | ICD-10-CM | POA: Insufficient documentation

## 2021-10-17 DIAGNOSIS — M5136 Other intervertebral disc degeneration, lumbar region: Secondary | ICD-10-CM | POA: Insufficient documentation

## 2021-10-17 DIAGNOSIS — R1084 Generalized abdominal pain: Secondary | ICD-10-CM | POA: Diagnosis not present

## 2021-10-17 LAB — COMPREHENSIVE METABOLIC PANEL
ALT: 14 U/L (ref 0–44)
AST: 19 U/L (ref 15–41)
Albumin: 4.9 g/dL (ref 3.5–5.0)
Alkaline Phosphatase: 56 U/L (ref 38–126)
Anion gap: 10 (ref 5–15)
BUN: 23 mg/dL (ref 8–23)
CO2: 24 mmol/L (ref 22–32)
Calcium: 9.4 mg/dL (ref 8.9–10.3)
Chloride: 100 mmol/L (ref 98–111)
Creatinine, Ser: 0.96 mg/dL (ref 0.61–1.24)
GFR, Estimated: >60 mL/min (ref >60)
Glucose, Bld: 101 mg/dL — ABNORMAL HIGH (ref 70–99)
Potassium: 3.4 mmol/L — ABNORMAL LOW (ref 3.5–5.1)
Sodium: 134 mmol/L — ABNORMAL LOW (ref 135–145)
Total Bilirubin: 1 mg/dL (ref 0.3–1.2)
Total Protein: 8.5 g/dL — ABNORMAL HIGH (ref 6.5–8.1)

## 2021-10-17 LAB — URINALYSIS, ROUTINE W REFLEX MICROSCOPIC
Bilirubin Urine: NEGATIVE
Glucose, UA: NEGATIVE mg/dL
Ketones, ur: NEGATIVE mg/dL
Leukocytes,Ua: NEGATIVE
Nitrite: NEGATIVE
Protein, ur: 30 mg/dL — AB
Specific Gravity, Urine: 1.029 (ref 1.005–1.030)
pH: 5.5 (ref 5.0–8.0)

## 2021-10-17 LAB — CBC
HCT: 48.4 % (ref 39.0–52.0)
Hemoglobin: 16.5 g/dL (ref 13.0–17.0)
MCH: 30.2 pg (ref 26.0–34.0)
MCHC: 34.1 g/dL (ref 30.0–36.0)
MCV: 88.6 fL (ref 80.0–100.0)
Platelets: 236 10*3/uL (ref 150–400)
RBC: 5.46 MIL/uL (ref 4.22–5.81)
RDW: 12.5 % (ref 11.5–15.5)
WBC: 8.6 10*3/uL (ref 4.0–10.5)
nRBC: 0 % (ref 0.0–0.2)

## 2021-10-17 LAB — LIPASE, BLOOD: Lipase: 26 U/L (ref 11–51)

## 2021-10-17 MED ORDER — DICYCLOMINE HCL 20 MG PO TABS: 20.0000 mg | ORAL_TABLET | Freq: Two times a day (BID) | ORAL | 0 refills | Status: DC | PRN

## 2021-10-17 MED ORDER — PANTOPRAZOLE SODIUM 40 MG PO TBEC: 40.0000 mg | DELAYED_RELEASE_TABLET | Freq: Every day | ORAL | 0 refills | Status: DC

## 2021-10-17 MED ORDER — IOHEXOL 300 MG/ML  SOLN
100.0000 mL | Freq: Once | INTRAMUSCULAR | Status: AC | PRN
  Administered 2021-10-17: 100 mL via INTRAVENOUS

## 2021-10-17 NOTE — ED Notes (Signed)
Patient ambulatory to bathroom.

## 2021-10-17 NOTE — ED Notes (Signed)
EMT-P provided AVS using Teachback Method. Patient verbalizes understanding of Discharge Instructions. Opportunity for Questioning and Answers were provided by EMT-P. Patient Discharged from ED.  ? ?

## 2021-10-17 NOTE — ED Provider Notes (Signed)
?[facility]PT ?Provider Note ? ? ?CSN: [id number] ?Arrival date & time: 10/17/21  1731 ? ?  ? ?History ? ?Chief Complaint  ?Patient presents with  ? Abdominal Pain  ? ? ?David Webster is a 66 y.o. male. ? ?HPI ? ?  ? ?66 year old male with a history of anxiety, depression, hyperlipidemia, inguinal hernia repair, presents with concern for abdominal pain. ? ?Reports a long time ago he was lifting something up, and after that his pain started.  The pain is located diffusely throughout his abdomen with him indicating pain both in the epigastric area, suprapubic area, and wrapping around bilaterally towards his back.  The pain is not significantly exacerbated by lifting or coughing.  He denies fevers, nausea, vomiting, constipation and diarrhea.  The pain has been worsening over the last 2 days, with severe acute pain keeping him up last night and a feeling of abdominal distention.  The pain is currently 8 out of 10 the pain is bloating, aching and pressure.  Denies any chest pain or shortness of breath.  Denies urinary symptoms including no difficulty urinating, dysuria.  ? ?Past Medical History:  ?Diagnosis Date  ? Anxiety   ? Depression   ? GERD (gastroesophageal reflux disease)   ? Hyperlipidemia   ? Memory difficulty 11/25/2017  ?  ?Past Surgical History:  ?Procedure Laterality Date  ? COLONOSCOPY    ? FRACTURE SURGERY    ? ankle, wrist  ? INGUINAL HERNIA REPAIR Bilateral   ? KNEE ARTHROSCOPY Right 08/02/2015  ? Procedure: RIGHT KNEE SCOPE WITH medial mensicus DEBRIDEMENT, removal of multiple loose bodies;  Surgeon: Ollen Gross, MD;  Location: [facility];  Service: Orthopedics;  Laterality: Right;  ? ROTATOR CUFF REPAIR Right   ? TONSILLECTOMY    ?  ?Home Medications ?Prior to Admission medications   ?Medication Sig Start Date End Date Taking? Authorizing Provider  ?dicyclomine (BENTYL) 20 MG tablet Take 1 tablet (20 mg total) by mouth 2 (two) times daily as needed for spasms. 10/17/21  Yes  Alvira Monday, MD  ?pantoprazole (PROTONIX) 40 MG tablet Take 1 tablet (40 mg total) by mouth daily for 7 days. 10/17/21 10/24/21 Yes Alvira Monday, MD  ?ALPRAZolam Prudy Feeler) 0.5 MG tablet Take 0.25-0.5 mg by mouth 2 (two) times daily as needed for anxiety or sleep. 01/29/17   [provider]  ?ibuprofen (ADVIL,MOTRIN) 800 MG tablet TAKE 1 TABLET (800 MG TOTAL) BY MOUTH EVERY 8 (EIGHT) HOURS AS NEEDED FOR MODERATE PAIN. 10/10/16   Kathryne Hitch, MD  ?Multiple Vitamin (MULTIVITAMIN WITH MINERALS) TABS tablet Take 1 tablet by mouth daily.    [provider]  ?Omega-3 Fatty Acids (FISH OIL) 1200 MG CAPS Take 1,200 mg by mouth daily.    [provider]  ?TRINTELLIX 20 MG TABS tablet Take 20 mg by mouth daily. 11/17/17   [provider]  ?vitamin E 400 UNIT capsule Take 400 Units by mouth daily.    [provider]  ?   ? ?Allergies    ?Elemental sulfur   ? ?Review of Systems   ?Review of Systems ? ?Physical Exam ?Updated Vital Signs ?BP 123/76   Pulse 90   Temp 99.5 ?F (37.5 ?C)   Resp 16   SpO2 98%  ?Physical Exam ?Vitals and nursing note reviewed.  ?Constitutional:   ?   General: He is not in acute distress. ?   Appearance: He is well-developed. He is not diaphoretic.  ?HENT:  ?   Head:  Normocephalic and atraumatic.  ?Eyes:  ?   Conjunctiva/sclera: Conjunctivae normal.  ?Cardiovascular:  ?   Rate and Rhythm: Normal rate and regular rhythm.  ?   Heart sounds: Normal heart sounds. No murmur heard. ?  No friction rub. No gallop.  ?Pulmonary:  ?   Effort: Pulmonary effort is normal. No respiratory distress.  ?   Breath sounds: Normal breath sounds. No wheezing or rales.  ?Abdominal:  ?   General: There is distension.  ?   Palpations: Abdomen is soft.  ?   Tenderness: There is generalized abdominal tenderness. There is no guarding.  ?Musculoskeletal:  ?   Cervical back: Normal range of motion.  ?Skin: ?   General: Skin is warm and dry.  ?Neurological:  ?   Mental  Status: He is alert and oriented to person, place, and time.  ? ? ?ED Results / Procedures / Treatments   ?Labs ?(all labs ordered are listed, but only abnormal results are displayed) ?Labs Reviewed  ?COMPREHENSIVE METABOLIC PANEL - Abnormal; Notable for the following components:  ?    Result Value  ? Sodium 134 (*)   ? Potassium 3.4 (*)   ? Glucose, Bld 101 (*)   ? Total Protein 8.5 (*)   ? All other components within normal limits  ?URINALYSIS, ROUTINE W REFLEX MICROSCOPIC - Abnormal; Notable for the following components:  ? Hgb urine dipstick SMALL (*)   ? Protein, ur 30 (*)   ? All other components within normal limits  ?LIPASE, BLOOD  ?CBC  ? ? ?EKG ?None ? ?Radiology ?CT ABDOMEN PELVIS W CONTRAST ? ?Result Date: 10/17/2021 ?CLINICAL DATA:  Abdominal pain. EXAM: CT ABDOMEN AND PELVIS WITH CONTRAST TECHNIQUE: Multidetector CT imaging of the abdomen and pelvis was performed using the standard protocol following bolus administration of intravenous contrast. RADIATION DOSE REDUCTION: This exam was performed according to the departmental dose-optimization program which includes automated exposure control, adjustment of the mA and/or kV according to patient size and/or use of iterative reconstruction technique. CONTRAST:  OMNIPAQUE IOHEXOL 300 MG/ML  SOLN COMPARISON:  None. FINDINGS: Lower chest: No acute abnormality. Hepatobiliary: There is diffuse fatty infiltration of the liver parenchyma. No focal liver abnormality is seen. No gallstones, gallbladder wall thickening, or biliary dilatation. Pancreas: Unremarkable. No pancreatic ductal dilatation or surrounding inflammatory changes. Spleen: Normal in size without focal abnormality. Adrenals/Urinary Tract: Adrenal glands are unremarkable. Kidneys are normal, without renal calculi, focal lesion, or hydronephrosis. Bladder is unremarkable. Stomach/Bowel: Stomach is within normal limits. Appendix appears normal. No evidence of bowel wall thickening, distention, or  inflammatory changes. Vascular/Lymphatic: Aortic atherosclerosis. No enlarged abdominal or pelvic lymph nodes. Reproductive: Moderate to marked severity prostate gland enlargement is seen. Other: No abdominal wall hernia or abnormality. No abdominopelvic ascites. Musculoskeletal: Degenerative changes seen at the level of L5-S1. IMPRESSION: 1. Hepatic steatosis. 2. Moderate to marked severity prostatomegaly. 3. Degenerative changes at the level of L5-S1. 4. Aortic atherosclerosis. Aortic Atherosclerosis (ICD10-I70.0). Electronically Signed   By: Aram Candela M.D.   On: 10/17/2021 22:15   ? ?Procedures ?Procedures  ? ? ?Medications Ordered in ED ?Medications  ?iohexol (OMNIPAQUE) 300 MG/ML solution 100 mL (100 mLs Intravenous Contrast Given 10/17/21 2144)  ? ? ?ED Course/ Medical Decision Making/ A&P ?  ?                        ?Medical Decision Making ?Amount and/or Complexity of Data Reviewed ?Labs: ordered. ?Radiology: ordered. ? ?Risk ?  Prescription drug management. ? ? ?66 year old male with a history of anxiety, depression, hyperlipidemia, inguinal hernia repair, presents with concern for abdominal pain. ? ?DDx includes appendicitis, pancreatitis, cholecystitis, pyelonephritis, nephrolithiasis, diverticulitis, SBO, incarcerated hernia, AAA. ? ?Labs completed and evaluated by me showed no evidence of pancreatitis, hepatitis, urinary tract infection, normal white blood cell count, normal hemoglobin.  No chest pain or shortness of breath, and with tenderness on exam and have low suspicion for cardiac etiology.  ? ?CT abdomen pelvis completed and shows no acute intra-abdominal findings, with findings of hepatic steatosis, moderate to marked severity prostatomegaly, degenerative changes of L5-S1.  No signs of cholecystitis on CT and have low clinical suspicion given more diffuse tenderness.  Consider possible pain radiating from back. No sign of emergent spinal pathology. ? ?He denies urinary symptoms, however  given significant prostate enlargement will refer to urology for evaluation.  Recommend follow-up with primary care physician. Given prescription for Bentyl and pantoprazole for possible gastritis. ? ? ? ? ? ? ? ? ?

## 2021-10-17 NOTE — ED Notes (Signed)
Patient transported to CT 

## 2022-11-22 ENCOUNTER — Other Ambulatory Visit (HOSPITAL_BASED_OUTPATIENT_CLINIC_OR_DEPARTMENT_OTHER): Payer: Self-pay | Admitting: Family Medicine

## 2022-11-22 DIAGNOSIS — Z136 Encounter for screening for cardiovascular disorders: Secondary | ICD-10-CM

## 2022-12-02 ENCOUNTER — Other Ambulatory Visit (HOSPITAL_BASED_OUTPATIENT_CLINIC_OR_DEPARTMENT_OTHER): Payer: Self-pay | Admitting: Family Medicine

## 2022-12-02 DIAGNOSIS — Z87891 Personal history of nicotine dependence: Secondary | ICD-10-CM

## 2022-12-02 DIAGNOSIS — Z136 Encounter for screening for cardiovascular disorders: Secondary | ICD-10-CM

## 2022-12-30 ENCOUNTER — Ambulatory Visit (HOSPITAL_BASED_OUTPATIENT_CLINIC_OR_DEPARTMENT_OTHER)
Admission: RE | Admit: 2022-12-30 | Discharge: 2022-12-30 | Disposition: A | Discharge status: Home / Self Care | Payer: Medicare Other | Source: Ambulatory Visit | Attending: Family Medicine | Admitting: Family Medicine

## 2022-12-30 ENCOUNTER — Other Ambulatory Visit (HOSPITAL_BASED_OUTPATIENT_CLINIC_OR_DEPARTMENT_OTHER): Payer: Medicare Other

## 2022-12-30 DIAGNOSIS — Z136 Encounter for screening for cardiovascular disorders: Secondary | ICD-10-CM | POA: Insufficient documentation

## 2022-12-30 DIAGNOSIS — Z87891 Personal history of nicotine dependence: Secondary | ICD-10-CM | POA: Insufficient documentation

## 2023-10-20 IMAGING — CT CT ABD-PELV W/ CM
2 of 5 series · 16 of 46 positions shown, 18 images · IV contrast (agent unspecified)
Comparison: None.

CLINICAL DATA: Abdominal pain.

EXAM:
CT ABDOMEN AND PELVIS WITH CONTRAST
TECHNIQUE: Multidetector CT imaging of the abdomen and pelvis was performed
using the standard protocol following bolus administration of
intravenous contrast.

[Series 2: abd pel w · axial · 0.72mm/px · z∈[+820,+1245]mm · 13 of 97 slices shown, 15 images]
[im 6/97  soft-tissue]
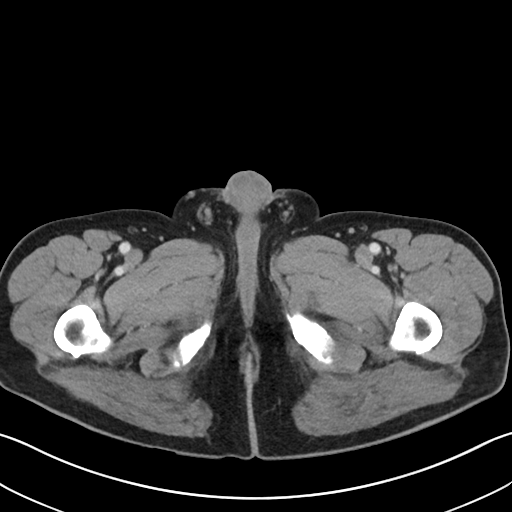
[im 6/97  bone]
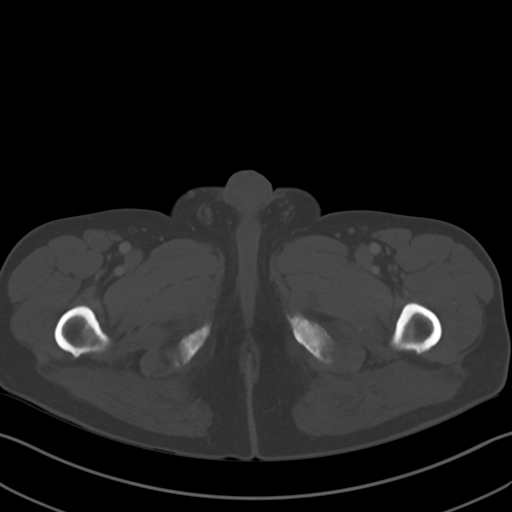
[im 11/97  soft-tissue]
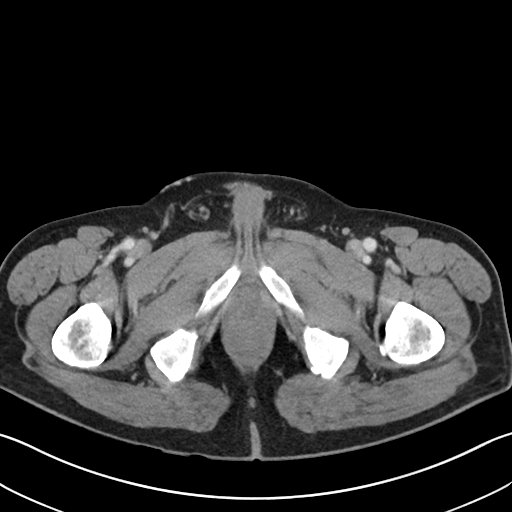
[im 22/97  soft-tissue]
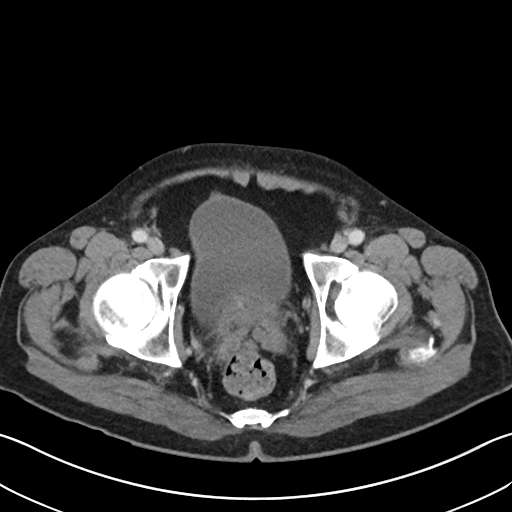
[im 27/97  soft-tissue]
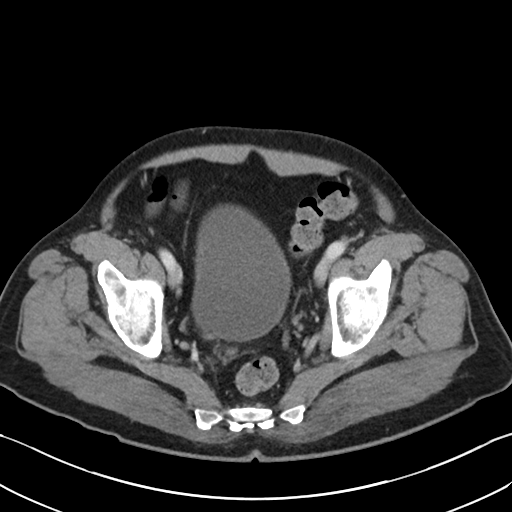
[im 33/97  soft-tissue]
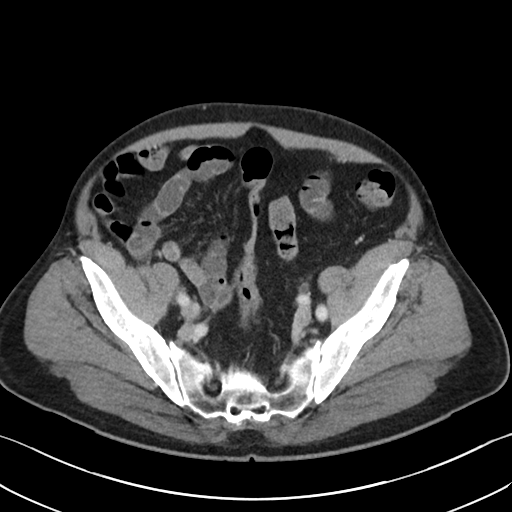
[im 43/97  soft-tissue]
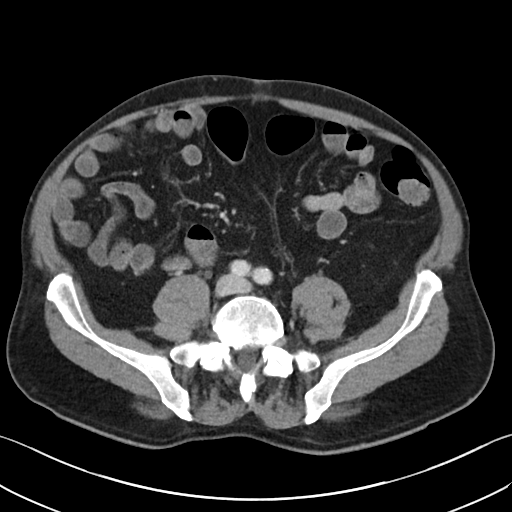
[im 49/97  soft-tissue]
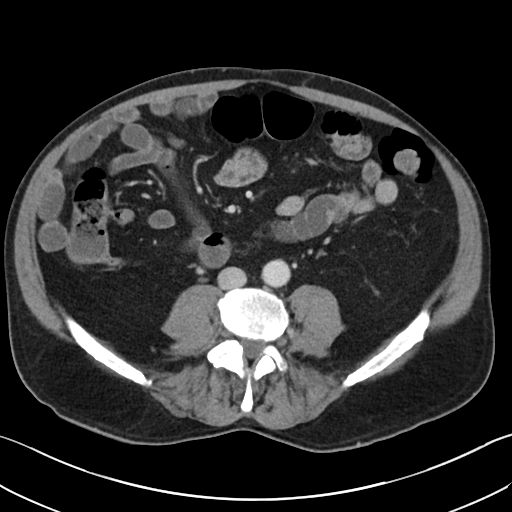
[im 54/97  soft-tissue]
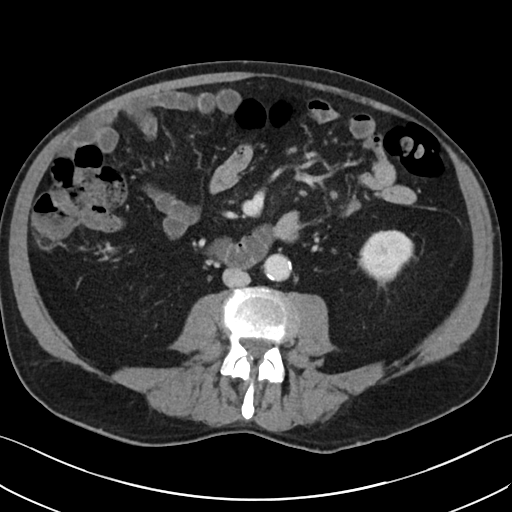
[im 65/97  soft-tissue]
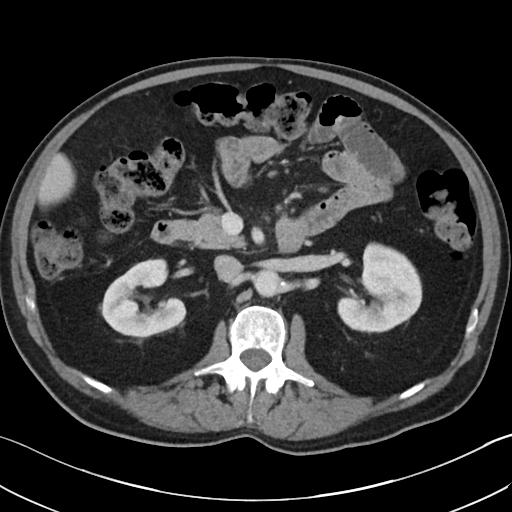
[im 65/97  bone]
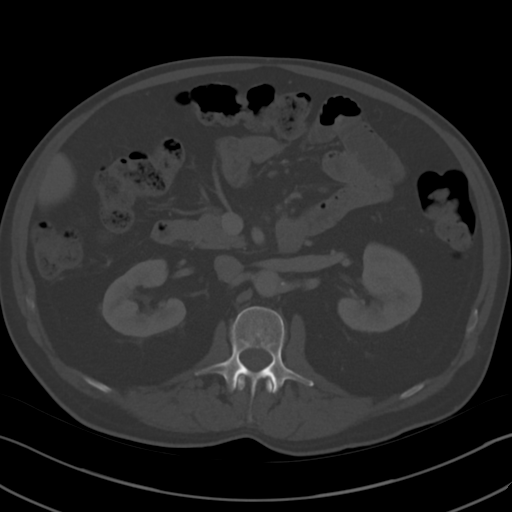
[im 70/97  soft-tissue]
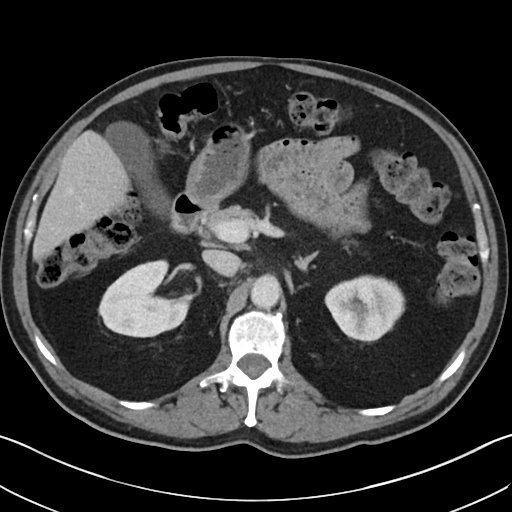
[im 75/97  soft-tissue]
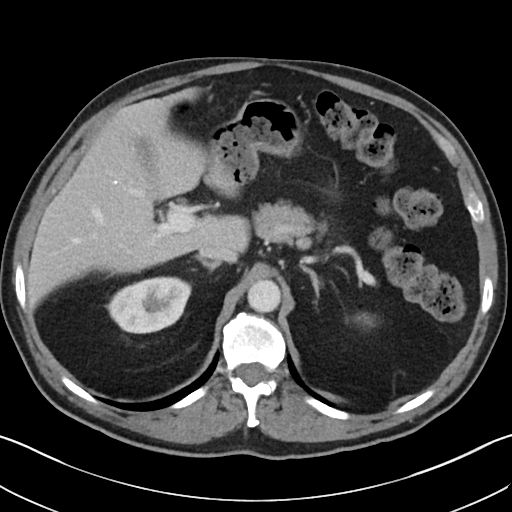
[im 86/97  soft-tissue]
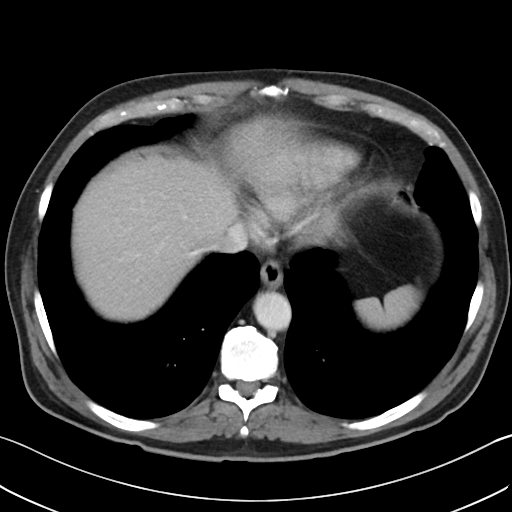
[im 91/97  soft-tissue]
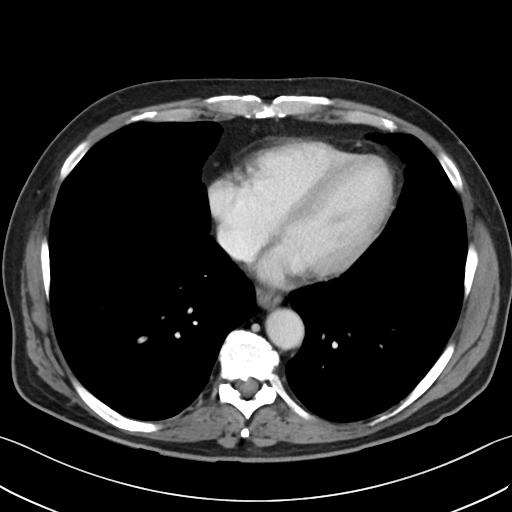

[Series 5: coronal · coronal · 0.76mm/px · 3 of 99 slices shown]
[im 33/99  soft-tissue]
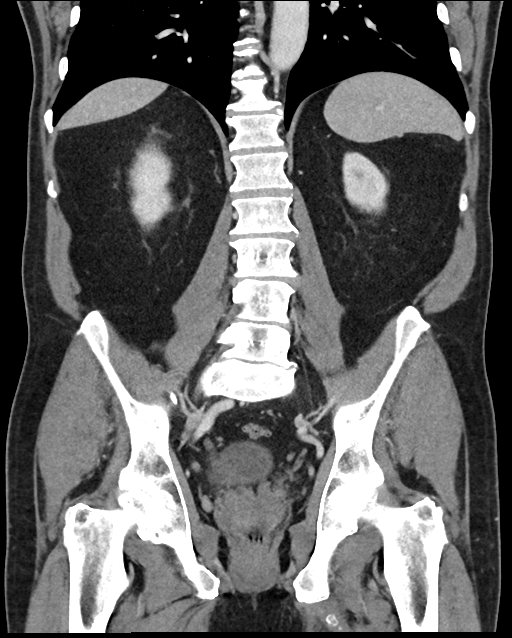
[im 44/99  soft-tissue]
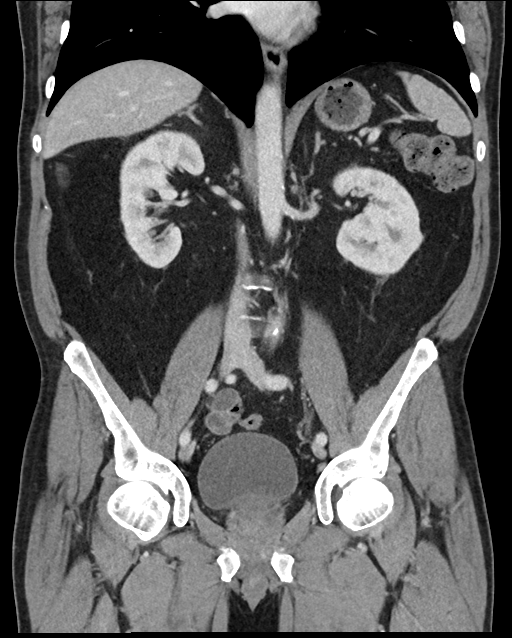
[im 55/99  soft-tissue]
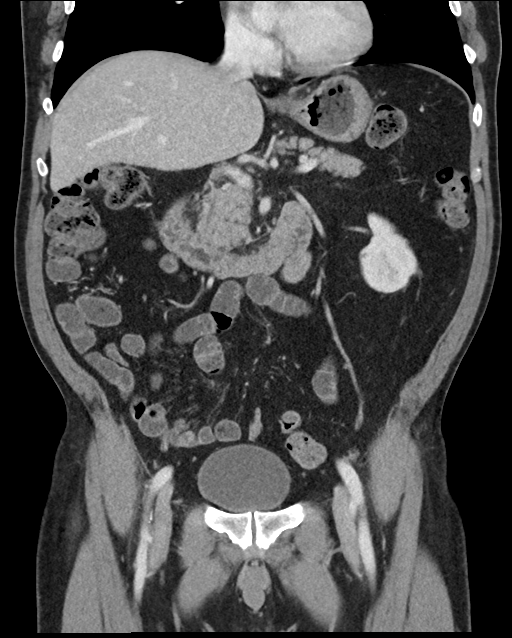

[16 of 46 positions shown; findings below may reference images not displayed]

RADIATION DOSE REDUCTION: This exam was performed according to the
departmental dose-optimization program which includes automated
exposure control, adjustment of the mA and/or kV according to
patient size and/or use of iterative reconstruction technique.

CONTRAST:  100mL OMNIPAQUE IOHEXOL 300 MG/ML  SOLN
FINDINGS: Lower chest: No acute abnormality.

Hepatobiliary: There is diffuse fatty infiltration of the liver
parenchyma. No focal liver abnormality is seen. No gallstones,
gallbladder wall thickening, or biliary dilatation.

Pancreas: Unremarkable. No pancreatic ductal dilatation or
surrounding inflammatory changes.

Spleen: Normal in size without focal abnormality.

Adrenals/Urinary Tract: Adrenal glands are unremarkable. Kidneys are
normal, without renal calculi, focal lesion, or hydronephrosis.
Bladder is unremarkable.

Stomach/Bowel: Stomach is within normal limits. Appendix appears
normal. No evidence of bowel wall thickening, distention, or
inflammatory changes.

Vascular/Lymphatic: Aortic atherosclerosis. No enlarged abdominal or
pelvic lymph nodes.

Reproductive: Moderate to marked severity prostate gland enlargement
is seen.

Other: No abdominal wall hernia or abnormality. No abdominopelvic
ascites.

Musculoskeletal: Degenerative changes seen at the level of L5-S1.
IMPRESSION: 1. Hepatic steatosis.
2. Moderate to marked severity prostatomegaly.
3. Degenerative changes at the level of L5-S1.
4. Aortic atherosclerosis.

Aortic Atherosclerosis ([id number].R).

## 2024-01-01 ENCOUNTER — Encounter: Payer: Self-pay | Admitting: Internal Medicine

## 2024-01-21 ENCOUNTER — Ambulatory Visit

## 2024-01-21 VITALS — Ht 71.0 in | Wt 172.0 lb

## 2024-01-21 DIAGNOSIS — Z1211 Encounter for screening for malignant neoplasm of colon: Secondary | ICD-10-CM

## 2024-01-21 NOTE — Progress Notes (Signed)
 No egg or soy allergy known to patient  No issues known to pt with past sedation with any surgeries or procedures Patient denies ever being told they had issues or difficulty with intubation  No FH of Malignant Hyperthermia Pt is not on diet pills Pt is not on  home 02  Pt is not on blood thinners  Pt has intermittent issues with constipation  No A fib or A flutter Have any cardiac testing pending--No Pt can ambulate  Pt denies use of chewing tobacco Discussed diabetic I weight loss medication holds Discussed NSAID holds Checked BMI  Miralax prep instructions provided (Pt refused anything with sulfa in it d/t grandmother dying from ingesting sulfa product)  Patient's chart reviewed by Norleen Schillings CNRA prior to previsit and patient appropriate for the [facility].  Pre visit completed and red dot placed by patient's name on their procedure day (on provider's schedule).

## 2024-02-05 ENCOUNTER — Encounter: Admitting: Internal Medicine

## 2024-02-11 ENCOUNTER — Ambulatory Visit: Admitting: Internal Medicine

## 2024-02-11 ENCOUNTER — Encounter: Payer: Self-pay | Admitting: Internal Medicine

## 2024-02-11 VITALS — BP 117/79 | HR 76 | Temp 97.7°F | Resp 17 | Ht 71.0 in | Wt 172.0 lb

## 2024-02-11 DIAGNOSIS — D123 Benign neoplasm of transverse colon: Secondary | ICD-10-CM

## 2024-02-11 DIAGNOSIS — K562 Volvulus: Secondary | ICD-10-CM

## 2024-02-11 DIAGNOSIS — Z1211 Encounter for screening for malignant neoplasm of colon: Secondary | ICD-10-CM

## 2024-02-11 DIAGNOSIS — K635 Polyp of colon: Secondary | ICD-10-CM | POA: Diagnosis not present

## 2024-02-11 DIAGNOSIS — Z860100 Personal history of colon polyps, unspecified: Secondary | ICD-10-CM

## 2024-02-11 MED ORDER — SODIUM CHLORIDE 0.9 % IV SOLN: 500.0000 mL | Freq: Once | INTRAVENOUS | Status: DC

## 2024-02-11 NOTE — Patient Instructions (Addendum)
 I found and removed one tiny polyp that looks benign.  All else normal.  I will let you know pathology results and when/if to have another routine colonoscopy by mail and/or My Chart.  I appreciate the opportunity to care for you. Lupita CHARLENA Commander, MD, FACG    YOU HAD AN ENDOSCOPIC PROCEDURE TODAY AT THE [facility]:   Refer to the procedure report that was given to you for any specific questions about what was found during the examination.  If the procedure report does not answer your questions, please call your gastroenterologist to clarify.  If you requested that your care partner not be given the details of your procedure findings, then the procedure report has been included in a sealed envelope for you to review at your convenience later.  YOU SHOULD EXPECT: Some feelings of bloating in the abdomen. Passage of more gas than usual.  Walking can help get rid of the air that was put into your GI tract during the procedure and reduce the bloating. If you had a lower endoscopy (such as a colonoscopy or flexible sigmoidoscopy) you may notice spotting of blood in your stool or on the toilet paper. If you underwent a bowel prep for your procedure, you may not have a normal bowel movement for a few days.  Please Note:  You might notice some irritation and congestion in your nose or some drainage.  This is from the oxygen used during your procedure.  There is no need for concern and it should clear up in a day or so.  SYMPTOMS TO REPORT IMMEDIATELY:  Following lower endoscopy (colonoscopy or flexible sigmoidoscopy):  Excessive amounts of blood in the stool  Significant tenderness or worsening of abdominal pains  Swelling of the abdomen that is new, acute  Fever of 100F or higher   For urgent or emergent issues, a gastroenterologist can be reached at any hour by calling [phone]. Do not use MyChart messaging for urgent concerns.    DIET:  We do recommend a small meal at  first, but then you may proceed to your regular diet.  Drink plenty of fluids but you should avoid alcoholic beverages for 24 hours.  ACTIVITY:  You should plan to take it easy for the rest of today and you should NOT DRIVE or use heavy machinery until tomorrow (because of the sedation medicines used during the test).    FOLLOW UP: Our staff will call the number listed on your records the next business day following your procedure.  We will call around 7:15- 8:00 am to check on you and address any questions or concerns that you may have regarding the information given to you following your procedure. If we do not reach you, we will leave a message.     If any biopsies were taken you will be contacted by phone or by letter within the next 1-3 weeks.  Please call us  at [phone] if you have not heard about the biopsies in 3 weeks.    SIGNATURES/CONFIDENTIALITY: You and/or your care partner have signed paperwork which will be entered into your electronic medical record.  These signatures attest to the fact that that the information above on your After Visit Summary has been reviewed and is understood.  Full responsibility of the confidentiality of this discharge information lies with you and/or your care-partner.

## 2024-02-11 NOTE — Progress Notes (Signed)
 Pt's states no medical or surgical changes since previsit or office visit.

## 2024-02-11 NOTE — Progress Notes (Signed)
 Sedate, gd SR, tolerated procedure well, VSS, report to RN

## 2024-02-11 NOTE — Op Note (Signed)
 [facility] Patient Name: David Webster Procedure Date: 02/11/2024 9:13 AM MRN: [id number] Endoscopist: Lupita FORBES Commander , MD, [id number] Age: 68 Referring MD:  Date of Birth: [birth year] Gender: Male Account #: [id number] Procedure:                Colonoscopy Indications:              High risk colon cancer surveillance: Personal                            history of colonic polyp (5 mm polyp no pathology                            2008), Last colonoscopy: 2013 no polyps Medicines:                Monitored Anesthesia Care Procedure:                Pre-Anesthesia Assessment:                           - Prior to the procedure, a History and Physical                            was performed, and patient medications and                            allergies were reviewed. The patient's tolerance of                            previous anesthesia was also reviewed. The risks                            and benefits of the procedure and the sedation                            options and risks were discussed with the patient.                            All questions were answered, and informed consent                            was obtained. Prior Anticoagulants: The patient has                            taken no anticoagulant or antiplatelet agents. ASA                            Grade Assessment: II - A patient with mild systemic                            disease. After reviewing the risks and benefits,                            the patient was deemed in satisfactory condition to  undergo the procedure.                           After obtaining informed consent, the colonoscope                            was passed under direct vision. Throughout the                            procedure, the patient's blood pressure, pulse, and                            oxygen saturations were monitored continuously. The                            CF [id number] #[id number] was  introduced through the anus                            and advanced to the the cecum, identified by                            appendiceal orifice and ileocecal valve. The                            colonoscopy was performed with moderate difficulty                            due to significant looping. Successful completion                            of the procedure was aided by applying abdominal                            pressure. The patient tolerated the procedure well.                            The quality of the bowel preparation was good. The                            ileocecal valve, appendiceal orifice, and rectum                            were photographed. The bowel preparation used was                            Miralax via split dose instruction. Scope In: 9:20:28 AM Scope Out: 9:39:54 AM Scope Withdrawal Time: 0 hours 11 minutes 21 seconds  Total Procedure Duration: 0 hours 19 minutes 26 seconds  Findings:                 The perianal and digital rectal examinations were                            normal. Pertinent negatives include normal prostate                            (  size, shape, and consistency).                           A 3 mm polyp was found in the splenic flexure. The                            polyp was sessile. The polyp was removed with a                            cold snare. Resection and retrieval were complete.                            Verification of patient identification for the                            specimen was done. Estimated blood loss was minimal.                           The exam was otherwise without abnormality on                            direct and retroflexion views. Complications:            No immediate complications. Estimated Blood Loss:     Estimated blood loss was minimal. Impression:               - One 3 mm polyp at the splenic flexure, removed                            with a cold snare. Resected and retrieved.                            - The examination was otherwise normal on direct                            and retroflexion views. Recommendation:           - Patient has a contact number available for                            emergencies. The signs and symptoms of potential                            delayed complications were discussed with the                            patient. Return to normal activities tomorrow.                            Written discharge instructions were provided to the                            patient.                           -  Resume previous diet.                           - Continue present medications.                           - Await pathology results.                           - No recommendation at this time regarding repeat                            colonoscopy due to age. Lupita FORBES Commander, MD 02/11/2024 9:46:39 AM This report has been signed electronically.

## 2024-02-11 NOTE — Progress Notes (Signed)
 [facility] History and Physical      Reason for Procedure:    Encounter Diagnosis  Name Primary?   Hx of colonic polyps Yes     Plan:    colonoscopy     HPI: David Webster is a 68 y.o. male here for a colonoscopy. 5 mm sigmoid polyp removed 2008 - no pathology No polyps 2013   Past Medical History:  Diagnosis Date   Anxiety    Depression    GERD (gastroesophageal reflux disease)    Hyperlipidemia    Memory difficulty 11/25/2017    Past Surgical History:  Procedure Laterality Date   COLONOSCOPY     FRACTURE SURGERY     ankle, wrist   INGUINAL HERNIA REPAIR Bilateral    KNEE ARTHROSCOPY Right 08/02/2015   Procedure: RIGHT KNEE SCOPE WITH medial mensicus DEBRIDEMENT, removal of multiple loose bodies;  Surgeon: Dempsey Moan, MD;  Location: [facility];  Service: Orthopedics;  Laterality: Right;   ROTATOR CUFF REPAIR Right    TONSILLECTOMY      Prior to Admission medications   Medication Sig Start Date End Date Taking? Authorizing Provider  buPROPion (WELLBUTRIN XL) 150 MG 24 hr tablet Take 150 mg by mouth every morning. 12/01/23  Yes [provider]  ALPRAZolam (XANAX) 0.5 MG tablet Take 0.25-0.5 mg by mouth 2 (two) times daily as needed for anxiety or sleep. Patient not taking: No sig reported 01/29/17   [provider]  ibuprofen  (ADVIL ,MOTRIN ) 800 MG tablet TAKE 1 TABLET (800 MG TOTAL) BY MOUTH EVERY 8 (EIGHT) HOURS AS NEEDED FOR MODERATE PAIN. 10/10/16   Vernetta Lonni GRADE, MD  LORazepam  (ATIVAN ) 1 MG tablet Take 0.5-1 mg by mouth daily as needed. 01/04/24   [provider]  Multiple Vitamin (MULTIVITAMIN WITH MINERALS) TABS tablet Take 1 tablet by mouth daily.    [provider]    Current Outpatient Medications  Medication Sig Dispense Refill   buPROPion (WELLBUTRIN XL) 150 MG 24 hr tablet Take 150 mg by mouth every morning.     ALPRAZolam (XANAX) 0.5 MG tablet Take 0.25-0.5 mg by mouth 2 (two) times daily as needed  for anxiety or sleep. (Patient not taking: No sig reported)     ibuprofen  (ADVIL ,MOTRIN ) 800 MG tablet TAKE 1 TABLET (800 MG TOTAL) BY MOUTH EVERY 8 (EIGHT) HOURS AS NEEDED FOR MODERATE PAIN. 270 tablet 0   LORazepam  (ATIVAN ) 1 MG tablet Take 0.5-1 mg by mouth daily as needed.     Multiple Vitamin (MULTIVITAMIN WITH MINERALS) TABS tablet Take 1 tablet by mouth daily.     Current Facility-Administered Medications  Medication Dose Route Frequency Provider Last Rate Last Admin   0.9 %  sodium chloride  infusion  500 mL Intravenous Once Avram Lupita BRAVO, MD        Allergies as of 02/11/2024 - Review Complete 02/11/2024  Allergen Reaction Noted   Elemental sulfur Other (See Comments) 07/14/2015    Family History  Problem Relation Age of Onset   Depression Mother    Anxiety disorder Mother    Stroke Father    Dementia Paternal Grandmother    Colon cancer Neg Hx    Esophageal cancer Neg Hx    Rectal cancer Neg Hx    Stomach cancer Neg Hx     Social History   Socioeconomic History   Marital status: Married    Spouse name: Not on file   Number of children: 2   Years of education: 12   Highest  education level: Not on file  Occupational History   Occupation: Retired  Tobacco Use   Smoking status: Former    Types: Cigars   Smokeless tobacco: Never   Tobacco comments:    07/20/15- none x 3 weeks  Vaping Use   Vaping status: Never Used  Substance and Sexual Activity   Alcohol use: Yes    Alcohol/week: 4.0 standard drinks of alcohol    Types: 4 Cans of beer per week   Drug use: No   Sexual activity: Not on file  Other Topics Concern   Not on file  Social History Narrative   Right handed    Caffeine use: Some ginger ale   No coffee   Lives with wife   Social Drivers of Corporate investment banker Strain: Not on file  Food Insecurity: Not on file  Transportation Needs: Not on file  Physical Activity: Not on file  Stress: Not on file  Social Connections: Not on file   Intimate Partner Violence: Not on file    Review of Systems:  All other review of systems negative except as mentioned in the HPI.  Physical Exam: Vital signs BP 126/82   Pulse 71   Temp 97.7 F (36.5 C) (Temporal)   Ht 5' 11 (1.803 m)   Wt 172 lb (78 kg)   SpO2 98%   BMI 23.99 kg/m   General:   Alert,  Well-developed, well-nourished, pleasant and cooperative in NAD Lungs:  Clear throughout to auscultation.   Heart:  Regular rate and rhythm; no murmurs, clicks, rubs,  or gallops. Abdomen:  Soft, nontender and nondistended. Normal bowel sounds.   Neuro/Psych:  Alert and cooperative. Normal mood and affect. A and O x 3   @Osby Sweetin  CHARLENA Commander, MD, [facility] [phone] (pager) 02/11/2024 9:11 AM@

## 2024-02-11 NOTE — Progress Notes (Signed)
 Called to room to assist during endoscopic procedure.  Patient ID and intended procedure confirmed with present staff. Received instructions for my participation in the procedure from the performing physician.

## 2024-02-12 ENCOUNTER — Telehealth: Payer: Self-pay | Admitting: *Deleted

## 2024-02-12 NOTE — Telephone Encounter (Signed)
  Follow up Call-     02/11/2024    8:15 AM  Call back number  Post procedure Call Back phone  # [phone]  Permission to leave phone message Yes     Patient questions:  Do you have a fever, pain , or abdominal swelling? No. Pain Score  0 *  Have you tolerated food without any problems? Yes.    Have you been able to return to your normal activities? Yes.    Do you have any questions about your discharge instructions: Diet   No. Medications  No. Follow up visit  No.  Do you have questions or concerns about your Care? No.  Actions: * If pain score is 4 or above: No action needed, pain <4.

## 2024-02-13 LAB — SURGICAL PATHOLOGY

## 2024-02-19 ENCOUNTER — Ambulatory Visit: Payer: Self-pay | Admitting: Internal Medicine
